# Patient Record
Sex: Male | Born: 1975 | Race: Black or African American | Hispanic: Refuse to answer
Health system: Western US, Academic
[De-identification: ages and names within clinical notes are randomized; demographics above are authoritative.]

## PROBLEM LIST (undated history)

## (undated) DIAGNOSIS — E785 Hyperlipidemia, unspecified: Secondary | ICD-10-CM

## (undated) DIAGNOSIS — E669 Obesity, unspecified: Secondary | ICD-10-CM

## (undated) DIAGNOSIS — M1711 Unilateral primary osteoarthritis, right knee: Secondary | ICD-10-CM

## (undated) DIAGNOSIS — K5792 Diverticulitis of intestine, part unspecified, without perforation or abscess without bleeding: Secondary | ICD-10-CM

## (undated) DIAGNOSIS — Z8669 Personal history of other diseases of the nervous system and sense organs: Secondary | ICD-10-CM

## (undated) DIAGNOSIS — K635 Polyp of colon: Secondary | ICD-10-CM

## (undated) HISTORY — PX: BARIATRIC SURGERY: SHX1103

## (undated) HISTORY — PX: COLONOSCOPY: SHX5228

## (undated) HISTORY — DX: Personal history of other diseases of the nervous system and sense organs: Z86.69

## (undated) HISTORY — DX: Unilateral primary osteoarthritis, right knee: M17.11

## (undated) HISTORY — DX: Hyperlipidemia, unspecified: E78.5

## (undated) HISTORY — DX: Obesity, unspecified: E66.9

## (undated) HISTORY — DX: Diverticulitis of intestine, part unspecified, without perforation or abscess without bleeding: K57.92

## (undated) HISTORY — DX: Polyp of colon: K63.5

---

## 2016-08-31 ENCOUNTER — Ambulatory Visit (INDEPENDENT_AMBULATORY_CARE_PROVIDER_SITE_OTHER): Payer: No Typology Code available for payment source | Admitting: Family Medicine

## 2016-08-31 ENCOUNTER — Encounter (INDEPENDENT_AMBULATORY_CARE_PROVIDER_SITE_OTHER): Payer: Self-pay | Admitting: Family Medicine

## 2016-08-31 ENCOUNTER — Ambulatory Visit: Payer: No Typology Code available for payment source | Attending: Family Medicine

## 2016-08-31 VITALS — BP 108/64 | HR 64 | Ht 69.0 in | Wt >= 6400 oz

## 2016-08-31 DIAGNOSIS — M1711 Unilateral primary osteoarthritis, right knee: Secondary | ICD-10-CM

## 2016-08-31 DIAGNOSIS — B351 Tinea unguium: Secondary | ICD-10-CM | POA: Insufficient documentation

## 2016-08-31 DIAGNOSIS — Z6841 Body Mass Index (BMI) 40.0 and over, adult: Secondary | ICD-10-CM

## 2016-08-31 DIAGNOSIS — G8929 Other chronic pain: Secondary | ICD-10-CM

## 2016-08-31 DIAGNOSIS — M545 Low back pain, unspecified: Secondary | ICD-10-CM

## 2016-08-31 DIAGNOSIS — Z833 Family history of diabetes mellitus: Secondary | ICD-10-CM

## 2016-08-31 DIAGNOSIS — B353 Tinea pedis: Secondary | ICD-10-CM | POA: Insufficient documentation

## 2016-08-31 DIAGNOSIS — R350 Frequency of micturition: Secondary | ICD-10-CM

## 2016-08-31 LAB — URINALYSIS COMPLETE, URN
Bacteria, URN: NONE SEEN
Bilirubin (Qual), URN: NEGATIVE
Epith Cells_Renal/Trans,URN: NEGATIVE /HPF
Epith Cells_Squamous, URN: NEGATIVE /LPF
Glucose Qual, URN: NEGATIVE mg/dL
Ketones, URN: NEGATIVE mg/dL
Leukocyte Esterase, URN: NEGATIVE
Nitrite, URN: NEGATIVE
Occult Blood, URN: NEGATIVE
Protein (Alb Semiquant), URN: NEGATIVE mg/dL
RBC, URN: NEGATIVE /HPF
Specific Gravity, URN: 1.02 g/mL (ref 1.006–1.029)
WBC, URN: NEGATIVE /HPF
pH, URN: 6 (ref 5.0–8.0)

## 2016-08-31 MED ORDER — CLOTRIMAZOLE-BETAMETHASONE 1-0.05 % EX CREA
1.0000 | TOPICAL_CREAM | Freq: Two times a day (BID) | CUTANEOUS | 3 refills | Status: DC
Start: 2016-08-31 — End: 2018-01-19

## 2016-08-31 MED ORDER — TERBINAFINE HCL 250 MG OR TABS
250.0000 mg | ORAL_TABLET | Freq: Every day | ORAL | 2 refills | Status: DC
Start: 2016-08-31 — End: 2017-03-25

## 2016-08-31 NOTE — Progress Notes (Signed)
Loma BostonI, Lauramae Kneisley H. Lucianne Smestad, D.O., interviewed and examined the patient while overseeing the documentation performed by my Medical Scribe. I have reviewed and revised as necessary the scribe's note and agree with the documented findings and plan of care. Please refer to the scribe's note below for further detailed information regarding the patient encounter and exam.  08/31/2016  2:51 PM.

## 2016-08-31 NOTE — Progress Notes (Signed)
DATE: 08/31/2016     ASSESSMENT AND PLAN:  No diagnosis found.    Onychomycosis/fungal infection on the plantar aspect of the feet:  1. Notable physical exam findings in today's examination included: onychomycosis of 5/5 of the right foot. Fungal infection on the plantar aspect of the feet.  2. Provided patient with Rx for Terbinafine HCl 250 mg oral tablets, take 1 tablet Po qD.  3. Provided patient with Rx for Clotrimazole-Betamethasone 1-0.05% External Cream, apply to affected areas of feet.  4. Recommend patient to clean and change socks often, and to wear socks anytime he is putting on new undergarments to avoid spreading the fungal infection to the groin region.    Nocturia:  1. Ordered labs: UA for further evaluation.  2. Follow-up at [facility] for PSA check, and a prostate exam.    Gastric bypass/weight gain/sleep apnea:  1. Possible referral to General Surgeon.  2. Possible referral to [facility].  3. Patient is encouraged to lose weight towards the goal of reaching a healthier BMI, through dietary discretion and exercise. Educated the patient on healthy diet protocol including a high protein and high vegetable diet with complex carbohydrates and low simple sugars. Emphasized healthy eating habits including eating every 3 hours with high protein, high complex carbohydrate, high vegetable, and low simple sugar diet. Provided a guideline for a daily meal plan. Patient is advised to cross-train with walking, biking, elliptical, and swimming.    Health Maintenance/PMHx diverticulitis/PMHx hyperlipidemia:  1. Patient will schedule a complete physical examination.  2. HM will be completed at that time.    All questions answered. Discharge and follow up instructions were discussed with the patient. Pt fully understands and is in agreement with the plan. Prior EMR records reviewed in EPIC as available and clinically relevant.    CHIEF COMPLAINT:   Chief Complaint   Patient presents with   . Establish Care    Establish Care - History of Diverticulits.    . Musculoskeletal Problem     Patient is having multiple areas of discomfort.  Has Mid/Lower Back pain for most of his adult life with increased discomfort.  Worse during winter months.  No real treatment.   Bilateral knee discomfort Right>Left.  Pain mostly on medial aspect of the knee.  No treatment.   He has developed some bruises on his feet and really unsure of cause.     . Urine Problem     Frequent Urination at night time he wants evaluated.     . Toenail     Lichen Plantus-  Family history and wants treated.      HPI:  Lance Lindsey is a 41 year old male who presents with multiple complaints and to establish care.    1. Patient presents with onychomycosis. His son also was Dx with Lichen plantus.    2. Patient additionally presents with nocturia.    3. Patient presents with Hx of diverticulitis.    4. Patient presents with discoloration on feet.    5. Patient presents with multiple areas of pain and discomfort, including mid-low back pain throughout his adult life, and bilateral medial knee pain, right > left.    6. Patient reports that he has been heavy his entire life, and was overweight to obese since he was a child. His wife reports that the patient does have sleep apnea.        PROBLEM LIST:  There is no problem list on file for this patient.  PAST MEDICAL HISTORY:  Past Medical History:   Diagnosis Date   . Lipidemia        SURGICAL HISTORY:  No past surgical history on file.    MEDICATIONS:  No outpatient prescriptions prior to visit.     No facility-administered medications prior to visit.        ALLERGIES:  Review of patient's allergies indicates:  Allergies   Allergen Reactions   . Morphine Nausea/Vomiting       FAMILY HISTORY:  Family History     Problem (# of Occurrences) Relation (Name,Age of Onset)    Arthritis (3) Father, Maternal Grandmother, Maternal Grandfather    Cancer (1) Maternal Grandfather    Diabetes (4) Mother, Father, Maternal  Grandmother, Maternal Grandfather    Heart Disease (2) Mother, Maternal Grandmother    Hypertension (2) Mother, Maternal Grandmother    Kidney Disorder (2) Maternal Grandmother, Maternal Grandfather          SOCIAL HISTORY:  Social History     Social History   . Marital status: N/A     Spouse name: N/A   . Number of children: N/A   . Years of education: N/A     Occupational History   . Not on file.     Social History Main Topics   . Smoking status: Never Smoker   . Smokeless tobacco: Never Used   . Alcohol use No   . Drug use: No   . Sexual activity: Yes     Partners: Female     Other Topics Concern   . Not on file     Social History Narrative   . No narrative on file       REVIEW OF SYSTEMS:  A 14-category review of systems reviewed with the patient, is otherwise unchanged and is noncontributory.     PHYSICAL EXAM:  BP 108/64   Pulse 64   Ht 5\' 9"  (1.753 m)   Wt (!) 415 lb (188.2 kg)   BMI 61.28 kg/m     Physical Examination: Vital signs as noted above.    General Appearance and Mental Status: Well developed, morbidly obese male Oriented x3, behavior is appropriate  EYES: Conjunctiva normal.  HENT: Head is atraumatic, normocephalic. Mucous membranes moist. Posterior oropharynx is without erythema or exudate. Nasal mucosa is normal. Bilateral ear canals and TMs normal.   NECK: Supple, non-tender  RESPIRATORY: Clear to auscultation and percussion without prolonged inspiratory or expiratory phase. No use of accessory muscles of respiration. No end expiratory wheezing.  CARDIOVASCULAR: Regular rate and rhythm with no murmurs, rubs or gallops. No carotid bruits or venous jugular distension.   EXTREMITIES: Moves all extremities well  MUSCULOSKELETAL: normal gait and station  SKIN: Warm, dry, normal skin. Non pallor. No ecchymosis. Negative for rashes, skin lesions, inflammation, induration, petechiae or purpura. Onychomycosis 5/5 toes of the right foot.  NEURO: Alert and oriented to person, place and time. No focal  deficits.     LAB/IMAGING RESULTS:  Labs and imaging were reviewed and interpreted by me, Elon Spanner, DO.     No results found for this or any previous visit.    Portions of this chart were written by Buddy Duty, Medical Scribe with oversight by Elon Spanner, DO on 08/31/2016  2:03 PM

## 2016-08-31 NOTE — Patient Instructions (Addendum)
It was a pleasure to be your provider today. I am very grateful you chose [facility] for your care. [facility] may send you a short survey asking you how we did today and how we can improve as a clinic team. I welcome any suggestions, recommendations and feedback that may help me and my team provide optimal care. If you have additional comments or concerns, please don't hesitate to contact me.     Sincerely,     Elon Spannerhomas H. Jansen, DO  Board Certified Family Practice  [phone]    Please be sure you are signed up for e-care, as this will allow easier access to direct communication with my staff and myself. Thanks again.            AFTER VISIT SUMMARY:    1. If medications were ordered, please pick up your medications from your pharmacy and take as prescribed. If you have additional medication-specific questions, please ask your pharmacist at time of pick-up.    2. Please stop at the front desk before you leave today, to schedule a complete physical examination in the next 4 weeks for FASTED labs (drink as much as you want, but no food for 12 hours prior to the physical exam).    3.    4.

## 2016-09-04 ENCOUNTER — Telehealth (INDEPENDENT_AMBULATORY_CARE_PROVIDER_SITE_OTHER): Payer: Self-pay | Admitting: Family Medicine

## 2016-09-04 NOTE — Telephone Encounter (Signed)
-----   Message from Junious Dresserhomas Howard Jansen, DO sent at 09/01/2016  8:35 AM PDT -----  Call Patient: UA is WNL

## 2016-09-16 ENCOUNTER — Encounter (INDEPENDENT_AMBULATORY_CARE_PROVIDER_SITE_OTHER): Payer: Self-pay | Admitting: Family Medicine

## 2016-09-16 ENCOUNTER — Ambulatory Visit (INDEPENDENT_AMBULATORY_CARE_PROVIDER_SITE_OTHER): Payer: No Typology Code available for payment source | Admitting: Family Medicine

## 2016-09-16 ENCOUNTER — Ambulatory Visit: Payer: No Typology Code available for payment source | Attending: Family Medicine

## 2016-09-16 VITALS — BP 110/75 | HR 81 | Temp 96.5°F | Ht 69.0 in | Wt >= 6400 oz

## 2016-09-16 DIAGNOSIS — Z833 Family history of diabetes mellitus: Secondary | ICD-10-CM | POA: Insufficient documentation

## 2016-09-16 DIAGNOSIS — Z6841 Body Mass Index (BMI) 40.0 and over, adult: Secondary | ICD-10-CM

## 2016-09-16 DIAGNOSIS — N401 Enlarged prostate with lower urinary tract symptoms: Secondary | ICD-10-CM | POA: Insufficient documentation

## 2016-09-16 DIAGNOSIS — R350 Frequency of micturition: Secondary | ICD-10-CM | POA: Insufficient documentation

## 2016-09-16 DIAGNOSIS — Z0000 Encounter for general adult medical examination without abnormal findings: Secondary | ICD-10-CM | POA: Insufficient documentation

## 2016-09-16 DIAGNOSIS — R351 Nocturia: Secondary | ICD-10-CM | POA: Insufficient documentation

## 2016-09-16 DIAGNOSIS — M62838 Other muscle spasm: Secondary | ICD-10-CM | POA: Insufficient documentation

## 2016-09-16 DIAGNOSIS — N400 Enlarged prostate without lower urinary tract symptoms: Secondary | ICD-10-CM | POA: Insufficient documentation

## 2016-09-16 LAB — CBC, DIFF
% Basophils: 0 %
% Eosinophils: 2 %
% Immature Granulocytes: 0 %
% Lymphocytes: 28 %
% Monocytes: 7 %
% Neutrophils: 63 %
% Nucleated RBC: 0 %
Absolute Eosinophil Count: 0.18 10*3/uL (ref 0.00–0.50)
Absolute Lymphocyte Count: 2.6 10*3/uL (ref 1.00–4.80)
Basophils: 0.03 10*3/uL (ref 0.00–0.20)
Hematocrit: 47 % (ref 38–50)
Hemoglobin: 14.6 g/dL (ref 13.0–18.0)
Immature Granulocytes: 0.03 10*3/uL (ref 0.00–0.05)
MCH: 26.4 pg — ABNORMAL LOW (ref 27.3–33.6)
MCHC: 31.2 g/dL — ABNORMAL LOW (ref 32.2–36.5)
MCV: 85 fL (ref 81–98)
Monocytes: 0.68 10*3/uL (ref 0.00–0.80)
Neutrophils: 5.87 10*3/uL (ref 1.80–7.00)
Nucleated RBC: 0 10*3/uL
Platelet Count: 264 10*3/uL (ref 150–400)
RBC: 5.52 10*6/uL (ref 4.40–5.60)
RDW-CV: 15.5 % — ABNORMAL HIGH (ref 11.6–14.4)
WBC: 9.39 10*3/uL (ref 4.3–10.0)

## 2016-09-16 LAB — URINALYSIS COMPLETE, URN
Bilirubin (Qual), URN: NEGATIVE
Epith Cells_Renal/Trans,URN: NEGATIVE /HPF
Epith Cells_Squamous, URN: NEGATIVE /LPF
Glucose Qual, URN: NEGATIVE mg/dL
Ketones, URN: NEGATIVE mg/dL
Leukocyte Esterase, URN: NEGATIVE
Nitrite, URN: NEGATIVE
Occult Blood, URN: NEGATIVE
RBC, URN: NEGATIVE /HPF
Specific Gravity, URN: 1.025 g/mL (ref 1.006–1.029)
WBC, URN: NEGATIVE /HPF
pH, URN: 5.5 (ref 5.0–8.0)

## 2016-09-16 LAB — LIPID PANEL
Cholesterol (LDL): 163 mg/dL — ABNORMAL HIGH (ref <130)
Cholesterol/HDL Ratio: 8.2
HDL Cholesterol: 31 mg/dL — ABNORMAL LOW (ref >39)
Non-HDL Cholesterol: 223 mg/dL — ABNORMAL HIGH (ref 0–159)
Total Cholesterol: 254 mg/dL — ABNORMAL HIGH (ref <200)
Triglyceride: 301 mg/dL — ABNORMAL HIGH (ref <150)

## 2016-09-16 LAB — TESTOSTERONE (ALL AGES): Testosterone: 2 ng/mL (ref 1.8–6.8)

## 2016-09-16 LAB — COMPREHENSIVE METABOLIC PANEL
ALT (GPT): 22 U/L (ref 10–64)
AST (GOT): 22 U/L (ref 9–38)
Albumin: 3.9 g/dL (ref 3.5–5.2)
Alkaline Phosphatase (Total): 90 U/L (ref 36–122)
Anion Gap: 9 (ref 4–12)
Bilirubin (Total): 0.3 mg/dL (ref 0.2–1.3)
Calcium: 9.5 mg/dL (ref 8.9–10.2)
Carbon Dioxide, Total: 27 meq/L (ref 22–32)
Chloride: 100 meq/L (ref 98–108)
Creatinine: 0.99 mg/dL (ref 0.51–1.18)
GFR, Calc, African American: >60 mL/min/{1.73_m2} (ref >59)
GFR, Calc, European American: >60 mL/min/{1.73_m2} (ref >59)
Glucose: 85 mg/dL (ref 62–125)
Potassium: 4.2 meq/L (ref 3.6–5.2)
Protein (Total): 8 g/dL (ref 6.0–8.2)
Sodium: 136 meq/L (ref 135–145)
Urea Nitrogen: 17 mg/dL (ref 8–21)

## 2016-09-16 LAB — PR OCCULT BLOOD, FECES, ONSITE: Occult Blood (1), Stool: NEGATIVE

## 2016-09-16 LAB — THYROID STIMULATING HORMONE: Thyroid Stimulating Hormone: 1.373 u[IU]/mL (ref 0.400–5.000)

## 2016-09-16 LAB — CRP WITH CARDIAC RISK: C_Reactive Protein: 25 mg/L — ABNORMAL HIGH (ref 0.0–10.0)

## 2016-09-16 LAB — PSA, TOTAL & REFLEXIVE FREE: PSA, Diagnostic/Monitoring: 0.18 ng/mL (ref 0.00–4.00)

## 2016-09-16 MED ORDER — CYCLOBENZAPRINE HCL 10 MG OR TABS
10.0000 mg | ORAL_TABLET | Freq: Three times a day (TID) | ORAL | 1 refills | Status: DC | PRN
Start: 2016-09-16 — End: 2019-03-20

## 2016-09-16 NOTE — Progress Notes (Signed)
DATE: 09/16/2016     ASSESSMENT AND PLAN:    ICD-10-CM ICD-9-CM    1. Annual physical exam Z00.00 V70.0 Urinalysis Complete, URN      THYROID STIMULATING HORMONE      TESTOSTERONE (ALL AGES)      OCCULT BLOOD, FECES, ONSITE      OCCULT BLOOD BY IA, STL      LIPID PANEL      HEMOGLOBIN A1C, HPLC      CRP WITH CARDIAC RISK      COMPREHENSIVE METABOLIC PANEL      CBC, DIFF      PSA, TOTAL & REFLEXIVE FREE   2. BMI 60.0-69.9, adult (HCC) Z68.44 V85.44 THYROID STIMULATING HORMONE      LIPID PANEL      HEMOGLOBIN A1C, HPLC   3. Family history of diabetes mellitus in first degree relative Z83.3 V18.0 LIPID PANEL      HEMOGLOBIN A1C, HPLC      COMPREHENSIVE METABOLIC PANEL   4. Benign prostatic hyperplasia with urinary frequency N40.1 600.01 PSA, TOTAL & REFLEXIVE FREE    R35.0 788.41    5. Increased urinary frequency R35.0 788.41 Urinalysis Complete, URN      PSA, TOTAL & REFLEXIVE FREE   6. Nocturia R35.1 788.43 Urinalysis Complete, URN      PSA, TOTAL & REFLEXIVE FREE   7. Routine adult health maintenance Z00.00 V70.0 LIPID PANEL      HEMOGLOBIN A1C, HPLC   8. Other muscle spasm M62.838 728.85 Cyclobenzaprine HCl 10 MG Oral Tab        GERD:  1. Possible EGD referral.    Nocturia/BPH:  1. Notable physical exam findings in today's examination included: prostate exam unable due to body habitus.  2. Ordered labs: PSA for further evaluation.  3. Referral to urology.    Significant foot over-pronation bilaterally:  1. Recommend OTC Dr. Margart Sickles orthotics.  2. If not improved, referral to podiatry for further evaluation.    Low back pain:  1. Patient is provided a Rx for Cyclobenzaprine 10 mg 0.5-1 tab PO BID-TID for muscle spasm at evenings and bedtime.    All question answered. Discharge and follow up instructions were discussed with the patient. Pt fully understands and is in agreement with the plan.     HPI:  The patient additionally presents with intermittent GERD, and emesis. Patient complains of acid reflux.     Patient  additionally has OSA, and uses a sleep apnea machine as per patient's significant other.    Patient presents to re-check nocturia.    Patient presents with Significant foot over-pronation bilaterally.    Patient complains of low back pain.    REVIEW OF SYSTEMS:  A 14-category review of systems reviewed with the patient, is otherwise unchanged and is noncontributory.     PHYSICAL EXAMINATION:   HEENT: Head is atraumatic, normocephalic. TMs are clear. Nasal mucosa is normal. Teeth and gums are normal.   RESPIRATORY:  CTA.  No wheeze, rales, rhonchi.  No use of accessory muscles of respiration.   CARDIOVASCULAR: Regular rate and rhythm with no murmurs, rubs or gallops. No jugular venous distension or carotid bruits.   LYMPH: There is no cervical, axillary, supraclavicular adenopathy.   EXTREMITIES: Full ROM in ankles, knees, hips, wrists and shoulders. Muscle testing is normal.   MUSCULOSKELETAL:   normal gait and station;  upper and lower extremities show no defects in symmetry, crepitance, tenderness,  masses, or effusion normal range of motion without dislocation, subluxation  or laxity; normal muscle tone and strength.  SKIN: Warm, dry, normal color. Negative for rashes, inflammation.  NEURO: Alert and oriented to person, place and time. Distal NVI. Normal sensation and DTR's are equal and symmetrical upper and lower extremities.    -----------------------------------------------------------------------------    DATE: 09/16/2016     ASSESSMENT:    ICD-10-CM ICD-9-CM    1. Annual physical exam Z00.00 V70.0 Urinalysis Complete, URN      THYROID STIMULATING HORMONE      TESTOSTERONE (ALL AGES)      OCCULT BLOOD, FECES, ONSITE      OCCULT BLOOD BY IA, STL      LIPID PANEL      HEMOGLOBIN A1C, HPLC      CRP WITH CARDIAC RISK      COMPREHENSIVE METABOLIC PANEL      CBC, DIFF      PSA, TOTAL & REFLEXIVE FREE   2. BMI 60.0-69.9, adult (HCC) Z68.44 V85.44 THYROID STIMULATING HORMONE      LIPID PANEL      HEMOGLOBIN A1C, HPLC    3. Family history of diabetes mellitus in first degree relative Z83.3 V18.0 LIPID PANEL      HEMOGLOBIN A1C, HPLC      COMPREHENSIVE METABOLIC PANEL   4. Benign prostatic hyperplasia with urinary frequency N40.1 600.01 PSA, TOTAL & REFLEXIVE FREE    R35.0 788.41    5. Increased urinary frequency R35.0 788.41 Urinalysis Complete, URN      PSA, TOTAL & REFLEXIVE FREE   6. Nocturia R35.1 788.43 Urinalysis Complete, URN      PSA, TOTAL & REFLEXIVE FREE   7. Routine adult health maintenance Z00.00 V70.0 LIPID PANEL      HEMOGLOBIN A1C, HPLC   8. Other muscle spasm M62.838 728.85 Cyclobenzaprine HCl 10 MG Oral Tab       PLAN:  Prior EMR records reviewed in EPIC as available and clinically relevant.  Notable physical exam findings:  Morbidly obese, BMI of 60.0 - 69.9 adult.  Very low line palate with redundant tissue.  Mildly limited lumbar flexion, and has to brace his hands on his knees to return to extension, otherwise, Full lumbar and thoracic ROM testing without pain.   Significant foot over-pronation bilaterally  Nevi. ephelides.   Normal male H&P labs ordered.  Continue current medications prescribed.  Medications Ordered this visit:   Patient is provided a Rx for Cyclobenzaprine 10 mg 0.5-1 tab PO BID-TID for muscle spasm at evenings and bedtime.  Regular aerobic exercise benefits discussed as well as healthy eating habits.  Health maintenance reviewed: Lipid screening ordered, Diabetes screening ordered.    All question answered. Discharge and follow up instructions were discussed with the patient. Pt fully understands and is in agreement with the plan.     CHIEF COMPLAINT:   Chief Complaint   Patient presents with   . Physical     Complete Physical & HCC evaluation and treatment.      HPI:  Lance Lindsey is a 41 year old male who presents for a complete physical examination. Fasting.    PROBLEM LIST:  Patient Active Problem List   Diagnosis   . Onychomycosis   . Tinea pedis of both feet   . BMI 60.0-69.9,  adult (HCC)   . Right knee DJD   . Chronic bilateral low back pain without sciatica   . Increased urinary frequency   . Family history of diabetes mellitus in first degree relative   . Annual  physical exam   . Nocturia   . Routine adult health maintenance   . BPH (benign prostatic hyperplasia)   . Other muscle spasm       PAST MEDICAL HISTORY:  Past Medical History:   Diagnosis Date   . Lipidemia        SURGICAL HISTORY:  No past surgical history on file.    MEDICATIONS:  Outpatient Medications Prior to Visit   Medication Sig Dispense Refill   . Clotrimazole-Betamethasone 1-0.05 % External Cream Apply 1 application to affected area on leg(s) 2 times a day. 45 g 3   . Rosuvastatin Calcium ([facility] OR)      . Terbinafine HCl 250 MG Oral Tab Take 1 tablet (250 mg) by mouth daily. 30 tablet 2     No facility-administered medications prior to visit.        ALLERGIES:  Review of patient's allergies indicates:  Allergies   Allergen Reactions   . Morphine Nausea/Vomiting       FAMILY HISTORY:  Family History     Problem (# of Occurrences) Relation (Name,Age of Onset)    Arthritis (3) Father, Maternal Grandmother, Maternal Grandfather    Cancer (1) Maternal Grandfather    Diabetes (4) Mother, Father, Maternal Grandmother, Maternal Grandfather    Heart Disease (2) Mother, Maternal Grandmother    Hypertension (2) Mother, Maternal Grandmother    Kidney Disorder (2) Maternal Grandmother, Maternal Grandfather          SOCIAL HISTORY:  Social History     Social History   . Marital status: N/A     Spouse name: N/A   . Number of children: N/A   . Years of education: N/A     Occupational History   . Not on file.     Social History Main Topics   . Smoking status: Never Smoker   . Smokeless tobacco: Never Used   . Alcohol use No   . Drug use: No   . Sexual activity: Yes     Partners: Female     Other Topics Concern   . Not on file     Social History Narrative    08/31/16: Patient is married, has a son who is 58 years old. Patient works  as an Biomedical scientist and as a DJ. Recently moved here from [place], [place].       REVIEW OF SYSTEMS:  A 14-category review of systems reviewed with the patient, is otherwise unchanged and is noncontributory.     PHYSICAL EXAM:  BP 110/75   Pulse 81   Temp 96.5 F (35.8 C) (Temporal)   Ht 5\' 9"  (1.753 m)   Wt (!) 415 lb (188.2 kg)   SpO2 96%   BMI 61.28 kg/m     Physical Examination: Vital signs as noted above.    General Appearance and Mental Status: Well developed, morbidly obese  male Oriented x3, behavior is appropriate  HEENT: Head is atraumatic, normocephalic. PERRLA and EOMI. B/L ear canals clear. TMs are intact. Nasal mucosa is normal. No septal deviation. Posterior oropharynx normal without erythema or exudate. No pain to palpation over the frontal or maxillary sinuses.  Teeth and gums are normal. Very low line palate with redundant tissue.  NECK: Supple, non-tender to palpation. Normal cervical ROM without pain. No anterior or posterior cervical adenopathy. No supraclavicular adenopathy. No thyromegaly.   RESPIRATORY: Clear to auscultation and percussion without prolonged inspiratory or expiratory phase. No use of accessory muscles of respiration. No end  expiratory wheezing.  CARDIOVASCULAR: Regular rate and rhythm with no murmurs, rubs or gallops. No carotid bruits or jugular venous distension. Distal pulses intact in BUE.  LYMPH: There is no cervical, supraclavicular, axillary, or epitrochlea adenopathy. No palpable cords.  No significant pedal edema.  ABDOMEN: Soft and positive bowel sounds in all four quadrants, non tender, non-tympanic, no rebound or guarding, no hepatosplenomegaly.   GENITOURINARY: No ulcerations or skin lesions. No CVA tenderness. No inguinal hernias bilaterally. Stool guaiac negative using a Seracult Plus guaiac test card during exam. Circumcised, Testes descended x 2, no masses.  prostate exam unable due to body habitus.  EXTREMITIES: Moves all extremities well.  Muscle  testing is normal.  MUSCULOSKELETAL:   normal gait and station;  upper and lower extremities show no defects in symmetry, crepitance, tenderness,  masses, or effusion. Mildly limited lumbar flexion, and has to brace his hands on his knees to return to extension, otherwise, Full lumbar and thoracic ROM testing without pain. No midline or paraspinal tenderness to palpation or percussion. Negative SLR without pain b/l. Full sit-up maneuver. Normal shoulder ROM and shoulder IR at the level of T10. Normal squat, duck walk, and heel and toe weight bearing exams. Significant foot over-pronation bilaterally, No bunions, no bunionettes.  SKIN: Warm, dry, normal color. Non-pallor, No ecchymosis. Negative for rashes, inflammation, induration, petechiae or purpura. No actinic or seborrheic keratosis. Nevi. ephelides. No onychomycosis.   NEURO: Alert and oriented to person, place and time. No focal deficits appreciated.  Normal sensation and DTR's are equal and symmetrical upper and lower extremities. No nystagmus.   PSYCH: Dressed appropriately, good eye contact, calm, normal affect, focused, non-anxious.     LAB/IMAGING RESULTS:  Images are interpreted by me, Elon Spannerhomas H. Jansen, DO.   Labs pending.    Portions of this chart were written by Buddy DutyAdam Coccia, Medical Scribe with oversight by Elon Spannerhomas H. Jansen, DO on 09/16/2016  11:01 AM

## 2016-09-16 NOTE — Progress Notes (Signed)
I, Anyely Cunning H. Calvary Difranco, D.O., interviewed and examined the patient while overseeing the documentation performed by my Medical Scribe. I have reviewed and revised as necessary the scribe's note and agree with the documented findings and plan of care. Please refer to the scribe's note below for further detailed information regarding the patient encounter and exam.  09/16/2016  11:54 AM.

## 2016-09-17 ENCOUNTER — Other Ambulatory Visit (HOSPITAL_BASED_OUTPATIENT_CLINIC_OR_DEPARTMENT_OTHER): Payer: No Typology Code available for payment source

## 2016-09-17 LAB — HEMOGLOBIN A1C, HPLC: Hemoglobin A1C: 5.7 % (ref 4.0–6.0)

## 2016-09-23 ENCOUNTER — Encounter (INDEPENDENT_AMBULATORY_CARE_PROVIDER_SITE_OTHER): Payer: Self-pay | Admitting: Family Medicine

## 2016-09-23 ENCOUNTER — Telehealth (INDEPENDENT_AMBULATORY_CARE_PROVIDER_SITE_OTHER): Payer: Self-pay | Admitting: Family Medicine

## 2016-09-23 DIAGNOSIS — E782 Mixed hyperlipidemia: Secondary | ICD-10-CM

## 2016-09-23 NOTE — Telephone Encounter (Signed)
General Message:    Detailed Message: Patient requested a call back to discuss his recent lab results.  Return Call: Detailed message on voicemail

## 2016-09-24 NOTE — Telephone Encounter (Signed)
Ed sent patient e-care message instructing him to call back to discuss next week when he is back in office as well as Dr Leane Para.

## 2016-09-29 ENCOUNTER — Other Ambulatory Visit (INDEPENDENT_AMBULATORY_CARE_PROVIDER_SITE_OTHER): Payer: Self-pay | Admitting: Family Medicine

## 2016-09-29 ENCOUNTER — Ambulatory Visit: Payer: No Typology Code available for payment source | Attending: Family Medicine

## 2016-09-29 DIAGNOSIS — Z0000 Encounter for general adult medical examination without abnormal findings: Secondary | ICD-10-CM

## 2016-09-29 MED ORDER — ATORVASTATIN CALCIUM 20 MG OR TABS
20.0000 mg | ORAL_TABLET | Freq: Every day | ORAL | 3 refills | Status: DC
Start: 2016-09-29 — End: 2017-11-10

## 2016-09-29 NOTE — Telephone Encounter (Signed)
Called and spoke to patient, discussed all labs in full.  Pended prescription for Dr. Leane Para to look at and approve.

## 2016-09-30 LAB — OCCULT BLOOD BY IA, STL: Occult Bld 1 Result: NEGATIVE

## 2016-10-05 ENCOUNTER — Telehealth (INDEPENDENT_AMBULATORY_CARE_PROVIDER_SITE_OTHER): Payer: Self-pay | Admitting: Family Medicine

## 2016-10-05 NOTE — Telephone Encounter (Signed)
-----   Message from Junious Dresser, DO sent at 09/17/2016  1:55 PM PDT -----  Call patient to review abnormal lab work.High chol(254) with high TRIGs(301), with low HDL(31), therefore high cardiac risk. Plan: ERX Lipitor  1 po q HS # 90 Rx3. Also, start  Fish oil pills 4 grams q day Re-test lipids in 3 months. Try to decrease weight and I would consider Testosterone replacement therapy due to low levels of T. PSA is totally WNL

## 2016-10-12 NOTE — Telephone Encounter (Signed)
Called patient on 09-28-2016

## 2017-01-28 ENCOUNTER — Encounter (INDEPENDENT_AMBULATORY_CARE_PROVIDER_SITE_OTHER): Payer: No Typology Code available for payment source | Admitting: Family Medicine

## 2017-02-11 ENCOUNTER — Ambulatory Visit (INDEPENDENT_AMBULATORY_CARE_PROVIDER_SITE_OTHER): Payer: No Typology Code available for payment source | Admitting: Family Medicine

## 2017-02-11 ENCOUNTER — Ambulatory Visit: Payer: No Typology Code available for payment source | Attending: Family Medicine

## 2017-02-11 ENCOUNTER — Encounter (INDEPENDENT_AMBULATORY_CARE_PROVIDER_SITE_OTHER): Payer: Self-pay | Admitting: Family Medicine

## 2017-02-11 VITALS — BP 123/71 | HR 71 | Ht 69.0 in | Wt >= 6400 oz

## 2017-02-11 DIAGNOSIS — Z833 Family history of diabetes mellitus: Secondary | ICD-10-CM

## 2017-02-11 DIAGNOSIS — Z9229 Personal history of other drug therapy: Secondary | ICD-10-CM | POA: Insufficient documentation

## 2017-02-11 DIAGNOSIS — Z6841 Body Mass Index (BMI) 40.0 and over, adult: Secondary | ICD-10-CM

## 2017-02-11 DIAGNOSIS — B351 Tinea unguium: Secondary | ICD-10-CM

## 2017-02-11 LAB — COMPREHENSIVE METABOLIC PANEL
ALT (GPT): 20 U/L (ref 10–64)
AST (GOT): 23 U/L (ref 9–38)
Albumin: 3.6 g/dL (ref 3.5–5.2)
Alkaline Phosphatase (Total): 81 U/L (ref 36–122)
Anion Gap: 6 (ref 4–12)
Bilirubin (Total): 0.3 mg/dL (ref 0.2–1.3)
Calcium: 9.2 mg/dL (ref 8.9–10.2)
Carbon Dioxide, Total: 28 meq/L (ref 22–32)
Chloride: 103 meq/L (ref 98–108)
Creatinine: 0.93 mg/dL (ref 0.51–1.18)
GFR, Calc, African American: >60 mL/min/{1.73_m2} (ref >59)
GFR, Calc, European American: >60 mL/min/{1.73_m2} (ref >59)
Glucose: 106 mg/dL (ref 62–125)
Potassium: 3.4 meq/L — ABNORMAL LOW (ref 3.6–5.2)
Protein (Total): 7 g/dL (ref 6.0–8.2)
Sodium: 137 meq/L (ref 135–145)
Urea Nitrogen: 13 mg/dL (ref 8–21)

## 2017-02-11 NOTE — Progress Notes (Signed)
REASON FOR VISIT:  1.  Review of medication for nail fungus and concerns about lack of progress.  2.  Referral for consideration of bariatric surgery.      Lance Lindsey is here for his second visit.  The last one was roughly a month ago.  At that time, his examination showed significant and severe onychomycosis of all 10 nails.  They were discolored, dystrophic, thickened and ridged and angulated.  It was a severe case of nail fungus.  He was started on Lamisil 250 mg 1 every day.  He is down to about 2 weeks left out of 12 week supply and he is concerned about lack of progress.  So I will do an exam in a minute and see what we have got.  I did explain to him carefully that the medicine essentially stays in the nail bed causing a suppressive effect on the fungus while healthy new nail grows out.  Eventually all the old fungal infected nail is clipped off and he will have a healthy fresh nail.    Also, patient has a severe overweight concern.  He is 5 feet 9 inches, weighs 415 pounds with a BMI of 61.3.  He and his wife have talked it over, they are very interested in pursuing weight management and possibly a surgical bariatric intervention.    PHYSICAL EXAMINATION:  GENERAL:  He is very pleasant, alert, and oriented, accompanied by his wife, son, and daughter today.  HEENT:  Shows a posterior pharynx with a decreased airway.  No adenopathy.  HEART:  Regular rate and rhythm with a rate of about 70 beats per minute.  No murmurs or gallops noted.  LUNGS:  Clear to auscultation with no CVA tenderness.  As stated, he is significantly overweight with a BMI of 61.3.  SKIN:  Skin evaluation, particularly nails, shows that there is about a quarter inch of healthy nail growth at the base of the great nail and at least one or two others on the right foot, so the nail is actually making good progress with respect to responding to the Lamisil medication.    OVERALL IMPRESSION:  1.  Morbid obesity, BMI of 61.  2.  Family medical  history of diabetes.  3.  Onychomycosis.  4.  Possible side effect to mildly high-risk medication, specifically terbinafine.    PLAN:  1.  Referral to [facility] bariatric surgical department.  2.  Comprehensive metabolic panel to review liver function tests for any effect from the terbinafine.  All questions were answered.  We will see the patient back as needed on a periodic basis.

## 2017-02-11 NOTE — Progress Notes (Signed)
Dictation #1  [id number]  WUX:[id number]

## 2017-02-19 ENCOUNTER — Telehealth (INDEPENDENT_AMBULATORY_CARE_PROVIDER_SITE_OTHER): Payer: Self-pay | Admitting: Family Medicine

## 2017-02-19 NOTE — Telephone Encounter (Signed)
-----   Message from Junious Dresserhomas Howard Jansen, DO sent at 02/12/2017  9:40 AM PDT -----  Call Patient all labs essentially WNL. K+ level is slightly low. Plan: Eat1-2 bannanas q day x 2 weeks to increase K+ level. No LFT abnormality  noted

## 2017-03-22 NOTE — Telephone Encounter (Signed)
Called and left message for patient that per Dr. Leane Para, all labs essentially WNL. K+ level is slightly low. Plan: Eat1-2 bannanas q day x 2 weeks to increase K+ level. No LFT abnormality  noted  If you have any questions or concerns, don't hesitate to call or e-mail me.

## 2017-03-24 ENCOUNTER — Encounter (HOSPITAL_BASED_OUTPATIENT_CLINIC_OR_DEPARTMENT_OTHER): Payer: Self-pay

## 2017-03-24 NOTE — Progress Notes (Signed)
BARIATRIC CLINIC OUTPATIENT CONSULTATION  03/25/2017     CHIEF COMPLAINT: "I wanna get the Roux-en-Y surgery"    HISTORY OF PRESENT ILLNESS:  Lance Lindsey is a 41 year old male referred by another physician: Dr. Leane Para for evaluation morbid obesity and for consideration for a surgical weight loss procedure. The patient has a Ht  (1.753 m) and Wt 428 lb 8 oz (194.366 kg) resulting in a  Body mass index is 63.28 kg/m. His highest adult weight was 448 pounds and this was 2-3 years ago. The patient has attended one of our informational seminars where information on various weight loss procedures and obesity were discussed. The patient says that he has undergone evaluation for bariatric surgery 4 times in the past at different location in the [place]. At least once he says he got to the "pre op" phase but has issues with changes in insurance coverage, and another time the hospital lost accreditation. He is interested in a laparoscopic Roux-en-Y gastric bypass. He feels from his prior experience that he has the most knowledge and information, and seems very effective. The patient has tried multiple methods of non-surgical weight loss in the past, including changing his eating habits (cutting carbs, reading nutritional labels to watch calories, using MyFitnessPal app on his phone) and exercise. He has undergone MSWL multiple times.  He has been able to lose up to 48 lbs with these attempts.  The patients exercise routine consists of treadmill 3 times per week for an hour.  The patient's dietary habits are significant for eating two meals per day. He eats a mid-morning meal consisting of two hot pockets. For dinner he usually has something his wife cooks. She tries to cook meat, protein, with smaller starch or pasta. He drinks a lot of water, propel, no sugar gatorade,  occasionally drinks a sugary beverage. He occasionally snacks on things like chicken nuggets several times per month. He does not eat late at night.   The patient feels that the major cause for weight gain is inconsistency with exercise.    The patient suffers from the following comorbid conditions associated with morbid obesity.  These include hyperlipidemia, obstructive sleep apnea, osteoarthritis and GERD. He also has some difficulty with chafing and open wounds on his abdominal skin folds.     The patient also reports:  no dysphagia   yes evidence of regurgitation  Yes  GERD, not on PPI    He indicates that he has undergone EGD and colonoscopy several years ago. They do not recall any specific abnormalities with these studies.     Bowel function daily regular bowel movements. He does have a history of diverticulitis that is aggravated by seeds, corn, peanuts.    The patient doesn't confirm a history of gallbladder disease.      Outside records were reviewed.  Relevant workup to date is summarized below:  Labs: Date: 02/11/17, CMP normal with the exception of potassium (K=3.4).   09/16/16: Elevated total cholesterol (254), LDL (163), and triglycerides (301). HbA1C 5.7.         PAST MEDICAL HISTORY:   Past Medical History:   Diagnosis Date    Colon polyps     Diverticulitis     History of sleep apnea     Lipidemia     Obesity     Right knee DJD       Patient Active Problem List   Diagnosis    Onychomycosis    Tinea pedis of both  feet    BMI 60.0-69.9, adult    Right knee DJD    Chronic bilateral low back pain without sciatica    Increased urinary frequency    Family history of diabetes mellitus in first degree relative    Annual physical exam    Nocturia    Routine adult health maintenance    BPH (benign prostatic hyperplasia)    Other muscle spasm    History of high risk medication treatment         PAST SURGICAL HISTORY:   Past Surgical History:   Procedure Laterality Date    COLONOSCOPY          FAMILY AND SOCIAL HISTORY:   family history includes Arthritis in his father, maternal grandfather, and maternal grandmother; Cancer in his maternal  grandfather; Diabetes in his father, maternal grandfather, maternal grandmother, and mother; Heart Disease in his father, maternal grandmother, and mother; Hypertension in his father, maternal grandmother, and mother; Kidney Disorder in his maternal grandfather and maternal grandmother; Obesity in his mother.   Social History   Substance Use Topics    Smoking status: Never Smoker    Smokeless tobacco: Never Used    Alcohol use No        Lives in [place] with wife and 3 kids. Recently moved to the area because he wife got a job and at [employer]. The patient is an Pharmacist, hospital and invests in homes. No alcohol, tobacco, or drugs.      MEDICATIONS:   Current Outpatient Prescriptions   Medication Sig Dispense Refill    Atorvastatin Calcium 20 MG Oral Tab Take 1 tablet (20 mg) by mouth daily. 90 tablet 3    Clotrimazole-Betamethasone 1-0.05 % External Cream Apply 1 application to affected area on leg(s) 2 times a day. 45 g 3    Cyclobenzaprine HCl 10 MG Oral Tab Take 1 tablet (10 mg) by mouth 3 times a day as needed for muscle spasms. 24 tablet 1    Rosuvastatin Calcium ([facility] OR)       Terbinafine HCl 250 MG Oral Tab Take 1 tablet (250 mg) by mouth daily. (Patient not taking: Reported on 03/25/2017) 30 tablet 2     No current facility-administered medications for this visit.          ALLERGIES:   Review of patient's allergies indicates:  Allergies   Allergen Reactions    Morphine Nausea/Vomiting    Fish Allergy Nausea/Vomiting        REVIEW OF SYSTEMS:   A complete ROS of completed. In addition to the findings listed in the HPI, the patient reports fatigue/trouble sleeping, history of carpal tunnel syndrome, blood in stool, joint/back pain, stiffness/arthritis, allergies. All other systems are negative.      SLEEP APNEA SCREENING: Previous diagnosis of OSA, uses CPAP every night.      PHYSICAL EXAM:  BP 122/78    Pulse 76    Temp 97.8 F (36.6 C) (Temporal)    Ht 5\' 9"  (1.753 m)    Wt (!) 428 lb 8 oz (194.4 kg)     SpO2 98%    BMI 63.28 kg/m    Physical Exam   Constitutional: He is oriented to person, place, and time.   Well developed obese male in no apparent distress.   HENT:   Head: Normocephalic and atraumatic.   Cardiovascular: Normal rate, regular rhythm and normal heart sounds.    Pulmonary/Chest: Effort normal and breath sounds normal. No respiratory distress.   Abdominal:  Soft. Bowel sounds are normal. He exhibits no distension. There is no tenderness. There is no rebound.   No surgical scars appreciated.   Neurological: He is alert and oriented to person, place, and time. GCS score is 15.   Skin: Skin is warm and dry.   Psychiatric: Mood, memory, affect and judgment normal.         ASSESSMENT:  Lance Lindsey is a 41 year old male who is morbidly obese with a Body mass index is 63.28 kg/m.  He has tried multiple methods of weight loss in the past and has not had success.  He has attended one of our informational seminars and came today interested in laparoscopic Roux-en-Y gastric bypass. The patient is appropriate for evaluation for a possible weight loss operation.  We will enroll the patient and pursue a multidisciplinary workup as outlined below.      1.  We discussed and went over the risks, benefits, expected outcomes, weight loss, and lifestyle changes associated with these operations. The risks discussed include but are not limited to bleeding, bruising, pain, infection, scarring, anastomotic leak, bleeding, stricture, MI, PE, stroke, infection, malnutrition, increased incidence of alcohol addiction and dumping syndrome.  Other complications can include bowel obstruction, chronic nausea and the need for prolonged nutritional supplementation.  The patient understands that these risks and complications can lead to readmission, reoperation, and even death.  The patient understood our discussion today and wishes to proceed.  We answered all questions in detail and to the patient's satisfaction.  The  patient was accompanied by spouse today at the time of our consultation.     2. This workup will consist of the following: screening lab work, social work consult, UGI, EGD, pH, manometry and nutrition consult.      3. Once this workup is completed, the patient will return to our clinic for re-assessment by one of our providers.  At that time, based upon the results of this workup, we will make a decision on whether or not to pursue surgery.      The patient thinks that changing his snacks would probably be one of the best things he could do. He would try replacing some of these snacks with a protein bar or a shake. As a family they have "slacked off" on going to the gym. He thinks he can go 4-5 times per week.       Instructed the patient to also confirm criteria by insurance and details of the 6 months MSWL.        Must start by using smaller plates, cups, bowls and utensils to decrease portion size.  Chewing food and eating slower.   Food and drink choice changes include make changes to snacking habits including replacing current snacks with protein shakes and bars.    Activity goal to start include increasing exercise to 5 times per week for at least 30 minutes, but ultimately aerobic exercise of a minimum of 150 minutes a week will promote best setting for weight loss.      Please make diet changes and exercise to lose as much as weight as possible prior to surgery, at least 28 pounds for a goal weight of 400 pounds.      I saw and evaluated the patient and agree with Dr. Carylon Perches' note.

## 2017-03-25 ENCOUNTER — Encounter (HOSPITAL_BASED_OUTPATIENT_CLINIC_OR_DEPARTMENT_OTHER): Payer: Self-pay | Admitting: Surgery

## 2017-03-25 ENCOUNTER — Ambulatory Visit: Payer: No Typology Code available for payment source | Attending: Surgery | Admitting: Surgery

## 2017-03-25 VITALS — BP 122/78 | HR 76 | Temp 97.8°F | Ht 69.0 in | Wt >= 6400 oz

## 2017-03-25 DIAGNOSIS — E785 Hyperlipidemia, unspecified: Secondary | ICD-10-CM | POA: Insufficient documentation

## 2017-03-25 DIAGNOSIS — R1013 Epigastric pain: Secondary | ICD-10-CM | POA: Insufficient documentation

## 2017-03-25 DIAGNOSIS — Z6841 Body Mass Index (BMI) 40.0 and over, adult: Secondary | ICD-10-CM | POA: Insufficient documentation

## 2017-03-25 NOTE — Progress Notes (Signed)
IMPRESSION and PLAN:      Lance Lindsey is interested in bariatric surgery, specifically laparoscopic Roux-en-Y gastric bypass. His is currently with a Body mass index is 63.28 kg/m. and Wt 428 lb 8 oz (194.366 kg).    Risk factor reduction includes the chance of reducing or eliminating the comorbidities that are driven by his obesity: Hypertension, OSA, OA and GERD.    Hyperlipidemia: currently controlled with statin and obesity only worsens this disease.   OSA: on cpap    I emphasized and counseled on the following:   Anatomies of the bariatric surgery options. We also went over benefits and potential risks such as but not limited to possibility of bleeding, infections, further interventions, bowel leak, clotting, injury to adjacent structures, organ failure, stroke, heart events and death. In addition other complications in the lifetime can include food intolerances, pain, nausea, marginal ulceration, obstructions, strictures hernias, internal hernias, nutritional deficiencies, addiction conversion, severe reflux and weight regain.    Importance of compliance with chosen treatment options and reiterating the patient contract. Patient must abide by that contract for compliance.  No nicotine use, smoking or NSAID use for lifetime after bariatric surgery.   Long term vitamin supplementation and adherence to [facility] Weight Management Team's recommendations.   Patient and family education as to what to expect in enrollment and need to demonstrate changes.   Screening labs.   CPAP compliance letter.   Nutrition consult.   Social work consult.   EGD for dyspepsia and evaluation for H. Pylori infection (a known risk factor for ulceration).   Of note, I discussed that his anatomy and BMI may be more risk with a roux en y gastric bypass and encourage to consider if sleeve was an option. To help understand risks of reflux and potential dysphagia, I recommend an UGI, pH and manometry studies, but is not a requirement to  move toward test review.   Enrollment weight is 428 lbs. Goal weight is 400 lbs (with 28 lbs).   6 months MSWL (beginning, middle end appointments).

## 2017-03-25 NOTE — Patient Instructions (Addendum)
You will be enrolled in the [facility] bariatrics program today.  A multidisciplinary workup will be scheduled for you as outlined on your after visit summary.  You will be contacted to schedule your testing and for follow up.    Increase aerobic exercise to at least 150 minutes per week.  Keep a log of all exercise to bring with you to all visits.   DO NOT GAIN WEIGHT. This will delay your surgery.    Insurance coverage and associated cost to you are up to your insurance. Please clarify with them. We cannot make assurances to you about this.  Check with them how many DIETICIAN visits are permitted/covered in a year.    See Checklist in manual. These are REQUIRED to be done before you can have surgery.    If you have any problems completing your checklist please do not hesitate to call    After surgery, attendance of appointments and nutrition education is expected.    Please make diet changes and exercise to lose as much as weight as possible prior to surgery, at least 28 pounds.    We discussed the expectations in enrollment in the [facility] Bariatric Surgery Program and need to demonstrate changes.     Nutrition consult   Social work consult   Screening labs   EGD for dyspepsia   PH and manometry study to evaluate esophageal function   Enrollment weight is 428 lbs.  Goal weight is 400 lbs   6 months MSWL (with beginning, middle, and end)   CPAP compliance     Must start by using smaller plates, cups, bowls and utensils to decrease portion size.  Chewing food and eating slower.    Food and drink choice changes include Make changes to snacking habits including replacing current snacks with protein shakes and bars.    Activity goal to start include increasing exercise to 5 times per week for at least 30 minutes, but ultimately aerobic exercise of a minimum of 150 minutes a week will promote best setting for weight loss.

## 2017-03-26 ENCOUNTER — Other Ambulatory Visit (HOSPITAL_BASED_OUTPATIENT_CLINIC_OR_DEPARTMENT_OTHER): Payer: Self-pay | Admitting: Surgery

## 2017-03-26 DIAGNOSIS — R1013 Epigastric pain: Secondary | ICD-10-CM

## 2017-04-14 ENCOUNTER — Ambulatory Visit: Payer: No Typology Code available for payment source | Attending: Surgery | Admitting: Clinical

## 2017-04-14 ENCOUNTER — Ambulatory Visit (HOSPITAL_BASED_OUTPATIENT_CLINIC_OR_DEPARTMENT_OTHER): Payer: No Typology Code available for payment source | Admitting: Registered"

## 2017-04-14 VITALS — Wt >= 6400 oz

## 2017-04-14 DIAGNOSIS — E785 Hyperlipidemia, unspecified: Secondary | ICD-10-CM

## 2017-04-14 DIAGNOSIS — Z6841 Body Mass Index (BMI) 40.0 and over, adult: Secondary | ICD-10-CM

## 2017-04-14 DIAGNOSIS — Z833 Family history of diabetes mellitus: Secondary | ICD-10-CM

## 2017-04-14 DIAGNOSIS — Z713 Dietary counseling and surveillance: Secondary | ICD-10-CM | POA: Insufficient documentation

## 2017-04-14 DIAGNOSIS — F4329 Adjustment disorder with other symptoms: Secondary | ICD-10-CM | POA: Insufficient documentation

## 2017-04-14 NOTE — Progress Notes (Signed)
Cleared nutrition for surgery:  No.  Not yet    Deferral item:  He is to make an appointment with one of our dietitians sometime on or after his 05/12/2017 portions class.  He will keep food journals until that time and provide evidence of making progress toward eating more frequently in the daytime -- aiming for a goal of eating roughly every 3-4 hours.  He does not have to be "perfect" in eating every 3-4 hours to clear nutrition for surgery; rather, he needs to demonstrate making progress toward this goal.           [facility]  Initial Nutrition Evaluation for Bariatric Surgery    Referred by: Dr.Chen-Meekin  Time spent with patient: 60 minutes  Medical Diagnosis: BMI 60+  Past medical history significant for:   Active Ambulatory Problems     Diagnosis Date Noted    Onychomycosis 08/31/2016    Tinea pedis of both feet 08/31/2016    BMI 60.0-69.9, adult ([place]) 08/31/2016    Right knee DJD 08/31/2016    Chronic bilateral low back pain without sciatica 08/31/2016    Increased urinary frequency 08/31/2016    Family history of diabetes mellitus in first degree relative 08/31/2016    Annual physical exam 09/16/2016    Nocturia 09/16/2016    Routine adult health maintenance 09/16/2016    BPH (benign prostatic hyperplasia) 09/16/2016    Other muscle spasm 09/16/2016    History of high risk medication treatment 02/11/2017    Hyperlipidaemia 03/25/2017    Dyspepsia 03/25/2017     Resolved Ambulatory Problems     Diagnosis Date Noted    No Resolved Ambulatory Problems     Past Medical History:   Diagnosis Date    Colon polyps     Diverticulitis     History of sleep apnea     Lipidemia     Obesity     Right knee DJD      Weight today: 429.9 lb  BMI: Estimated body mass index is 63.28 kg/m as calculated from the following:    Height as of 03/25/17: 5' 9"  (1.753 m).    Weight as of 03/25/17: 428 lb 8 oz (194.4 kg).  Initial weight:  428.5 lb Date: 03/25/17  Estimated protein needs:  75-110 g (1-1.5 g/kg IBW)  Labs : A1c 5.7 (09/16/16 -- Medical staff can determine whether or not this is recent enough), Albumin 3.6, 02/11/17  Goal Weight: 400# per Dr. Joseph Art note     Patient Interview  Motivation for weight loss surgery: wants to improve quality of life, wants to enjoy his children   Previous weight loss attempts: (pasted from Dr. Lianne Moris intake note and reviewed/confirmed with patient) -- cutting carbs, reading nutritional labels to watch calories, using MyFitnessPal app on his phone) and exercise.He has undergone MSWL multiple times.  He has been able to lose up to 48 lbs with these attempts.   States highest weight was 448 lbs at highest weight.      Patient desiring RYGB to improve health and mobility  Patient has not been meeting with a local dietitian for diet education. Will meet with our clinic to do that.  (As part of prior bariatric programs met with dietitian(s).  Pt is able to identify the following concepts about nutrition/post-surgical nutrition: knows he needs adequate protein intake, lifelong vitamin supplementation, first week or so on liquids, graduate to soft foods, advance further after that    Currently eating habits/routine:  Wake-up: 5-6 AM  B/L: 10AM-12 PM -- chicken nuggets  Sn:  No   D:  Varies -- yesterday veggie yakisoba with shrimp; eats typically between 5-7 PM; usually wife cooks dinner  Sn:  Not typically   Common beverages: water; no coffee; occasionally drinks low sugar Gatorade -- one-2 small single serve containers every 2-3 days      Food allergies: fish (not shellfish)  Food Intolerances/Avoidances: states no  Living Situation/Grocery shopping/Cooking: lives with wife and 3 kids ages 46, 58, and 28; wife does the grocery shopping and cooking  Alcohol: none Last time had a drink: can't even remember -- maybe 2 years ago on a cruise  Smoking: no    Hx of eating disorder/significant emotional/binge eating: does not feel he's had this problem  Sleep:  typically gets 6 hours of sleep, he reports; does endorse sleep apnea diagnosis, uses CPAP; does report feeling rested for the most part upon waking; has plans to change schedule to go to bed earlier   Vitamins/minerals: not currently    Digestion/dentition: reports having diverticulitis -- can't eat corn, things with seeds, peanuts, says he was given dietary restrictions in 2012 (had a flare and was given dietary restrictions at that time)   Blood Sugar: doesn't check; no noted hx of diabetes  Exercise: lots of stairs in house -- going up and down at home   Occupation: not currently working outside the home      Calorie goal for weight loss:  1735 is a 500 kcal deficit using [place] with a 1.1 activity factor.  OK to go down to as low as 1500 for a slightly greater deficit, though if exercise increases he should stick with 1735.      At 1735 calories:    Carbs (g) Protein (g) Fats (g)   45% 20% 35%   195 87 67            Not currently tracking using MyFitnessPal, but willing to start    Assessment & Education  Nutrition diagnosis: Obesity r/t physical inactivity and past excess energy intake as evidenced by BMI>30, and patient report of sedentary activities and diet recall.      Pattern of long periods without eating, inadequate fiber/fruit/vegetable intake.      Uncertain if current diabetes control/management is acceptable as indicated by A1c; A1C 5.7 09/11/16 -- medical team can assess whether this is recent enough     He was given the following instructions in addition to standard patient instructions (see wrap up/patient instructions):    Aim to eat every 3-4 hours.  Make an appointment with a dietitian around the time you come back for your portions/tracking class and show her your food journals to demonstrate progress toward eating every 3-4 hours.   After that visit is fine -- just make sure the time gap is at least 4 weeks from now.       Add in higher fiber carbohydrate sources -- 195 grams of  carbohydrate per day is just fine.  Whole grains -- for example whole grain breads, brown rice, quinoa/millet/oatmeal, whole grain noodles -- vegetables, fruits.      Track using MyFitnessPal    Reviewed Life-long Lifestyle and Diet Changes:  Requirement for life-long change in eating habits to begin now  Life-long intake of specific vitamins and minerals  Risk of Dumping Syndrome  Importance of exercise beyond daily ADLs    No interpretive services needed. Education was provided taking into consideration  the patient's learning abilities, readiness to learn and cultural beliefs. The patient was able to verbalize understanding of material reviewed below and they are  ready to start changing habits. Pt attended with spouse today.    Medical Nutrition Therapy Goals: Long term weight management and improved health  Patient Goals:   See "Patient Instructions" for full nutrition education details

## 2017-04-14 NOTE — Progress Notes (Signed)
__X_NO  ___YES  The patient demonstrates willingness to comply with the preoperative and postoperative treatment plans. The patient has been cleared psychosocially to proceed to bariatric surgery as of ____  .      Lance Lindsey  41 year old  [birth year]  Surgery: RYGB  Referral Source: Rhett Bannister, MD  [address]  [place] [id number]  [place], [place] [address]  Weight/BMI: 429.9 lb/63.49    Duration: 60 minutes    Present at evaluation: Patient and wife, Lance Lindsey.     Social Supports & Post-Operative Care Plan  Follow-Up Appointment Plan: The patient will drive himself to his appointments. If he is unable to do so his wife will drive him.     Social Support System: Lance Lindsey lives in [place], [place] with his wife and three children ages 41, 87 and 1. He moved to the area from [place] because his wife got a job at [employer] [place]. The patient reports that his children are doing well. 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 The patient reports that he has been overweight since childhood. He believes that his weight gain is related to poor food choices and lack of exercise. Lance Lindsey denies frequent snacking or emotional eating. He states that he often forgets to eat during the day. He has not yet made any changes in his habits since his intake visit on 03/25/17. The patient reports that his wife is very supportive of his desire to have surgery and to lose weight.     Post-Operative Care Plan: The patient's wife, Lance Lindsey ([phone]), will transport him to and from surgery and will be his post-operative caregiver.     Have an adequate post-operative care plan? NO    Income Source: Wife's employment.   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   Medical Insurance:  Payor/Plan Subscriber Name Rel Member # Group #   [employer]* Lance Lindsey,Lance Lindsey SPO  [id number]      [address]         Patient's primary surgery goals: "To live  a long and healthy life." Lance Lindsey is concerned that he will develop medical problems related to obesity that will shorten his life span. He would also like to be more mobile.     Does the patient:   Know about bariatric surgery support groups? YES  Has the patient been encouraged to attend? YES  Take current medication/therapies as prescribed? YES  Know they may need to purchase nutritional supplements out of pocket? YES    Mental Health    Presentation:    Appearance: Neatly groomed and dressed.   Behavior/activity: No sign of psychomotor agitation or retardation.   Speech: Normal rate, prosody and volume.   Affect: Appropriate to content.   Mood: Euthymic.   Thought form: Linear, goal directed and coherent.   Thought content: No sign of thought disorder or hallucination.   Orientation: Fully oriented.  Attention/concentration: No indication of distractibility.   Insight and judgement: Good.     Mental Health Diagnosis:Lance Lindsey denies any current or historical mental health diagnoses. He denies having had problems with depression, anxiety or other problems.     Psychotropic Medications: None.     Prescribing Provider: N/A    Mental health treatment: None    Length of psychiatric stability: Ongoing    Suicidal history:Denied    Abuse/Mistreatment: Denied    GAD-7  Over the last two  weeks, how often have you been bothered by the following problems?  Feeling nervous, anxious, or on edge: Not at all  Not being able to stop or control worrying: Not at all  Worrying too much about different things: Not at all  Trouble relaxing: Not at all  Being so restless that it is hard to sit still: Not at all  Becoming easliy annoyed or irritable: Not at all  Feeling afraid, as if something awful might happen: Not at all  Total Score  Total: 0  If you checked any problems, how difficult have they made it for you to do your work, take care of things at home, or get along with other people?: Not difficult at all    PHQ-9  Over the  last 2 weeks how often have you been bothered by any of the following problems?  1. Little interest or pleasure in doing things: Not at all  2. Feeling down, depressed or hopeless: Not at all  3. Trouble falling or staying asleep, or sleeping too much: Several days  4. Feeling tired or having little energy : Several days  5. Poor appetite or overeating: Not at all  6. Feeling bad about yourself - or that you are a failure or have let yourself or your family down: Not at all  7. Trouble concentrating on things, such as reading the newspaper or watching television: Not at all  8. Moving or speaking much more slowly than usual.  Or the opposite - fidgety or restless: Not at all  9. Thoughts that you would be better off dead or of hurting yourself in some way: Not at all    Total  PHQ9 Total Score: 2    10. If you checked off any problems, how difficult have these problems made it for you to do your work, take care of things at home, or get along with other people?: Not difficult at all    CAGE Screening  Cage Questionnaire  Have you ever felt you should cut down on your drinking?: No  Have people annoyed you by criticizing your drinking?: No  Have you felt bad or guilty about your drinking?: No    EE-I (emotional eating index, out of 40) score: 2-No or little emotional eating. Patient denies any problems with emotional eating. Denies eating when upset, sad, angry or bored.     Does the patient report symptoms of current or historical eating disorder? NO    QEWP-R: Not completed. Verbal questions do not indicate a problem with BED.     Coping Skills: Rides his motorcycle. Loves to relax by listening to music, patient says "it's a passion."    Chemical Dependency  Tobacco:None    Alcohol: None    Illicit Drugs: None    Treatment: None      Recent behavior changes: None    Weight loss in last 3 months: Gain of 1 lb. Mr. Lance Lindsey states that he has not implemented any changes in his dietary or activity habits. The patient was  asked to lose 28 lbs by Dr. Imogene Burnhen. His goal weight is 400 lbs.     Current physical activity: Walking. The patient states that he will begin to work out at gym this week and is planning on beginning to track his food and reduce caloric intake as well. "I won't have trouble doing it."    The patient was reminded that they are to track their exercise and bring their log to their physical  therapy appointment for review.   Summary by Assessment Area  Patient cleared for:   Post-operative care plan YES  Medical adherence YES  Mental health YES  Chemical dependency (includes smoking) YES  Surgery preparation NO  Eating behaviors YES     Summary: Lance Lindsey is a pleasant patient who was referred to social work as part of a multidisciplinary assessment for bariatric surgery. He has been through the work-up for bariatric surgery 4 times and insurance problems have resulted in him never getting surgery. It is concerning that he has made no changes in the nearly three weeks since he was enrolled in this program. The patient appears to have a good understanding of the changes he needs to make both pre and post-surgery. It remains to be seen if he can make the changes necessary to prepare for surgery and to lose the weight required by his surgeons.   The patient's wife is very supportive and he has the financial resources available to eat healthy food and enroll in a gym.     Recommended plan     From a social work perspective the following issues will need attention prior to proceeding to surgery:    1. The patient will attend at least one in-person support group and will explore support and information web sites for bariatric surgery patients.    2. The patient will begin to exercise 150 minutes per week.    The patient was also asked to focus on eating 3 meals and two snacks each day and tracking food and exercise.     When the above issues have been successfully addressed the patient may be a reasonable candidate for  surgery.  At that time they may be forwarded to test review.   The patient was given resource information including how to contact social work with questions. Local support group information given. The patient understands and is in agreement with this plan as outlined above. Social work is available to assist as needed.        Venetia Maxon, MSW

## 2017-04-14 NOTE — Patient Instructions (Signed)
Patient Goals:     Aim to eat every 3-4 hours.  Make an appointment with a dietitian around the time you come back for your portions/tracking class and show her your food journals to demonstrate progress toward eating every 3-4 hours.   After that visit is fine -- just make sure the time gap is at least 4 weeks from now.       Add in higher fiber carbohydrate sources -- 195 grams of carbohydrate per day is just fine.  Whole grains -- for example whole grain breads, brown rice, quinoa/millet/oatmeal, whole grain noodles -- vegetables, fruits.      Track using MyFitnessPal    BEHAVIOR MODIFICATION  Connect with a support group and review bariatric information online to improve knowledge and understanding of bariatric diet and eating habits for success    Consider meeting with a therapist to improve relationship with food to achieve and maintain a healthy weight lifelong    Journal or keep a diary if notice eating after stress, boredom, anger, anxiety, grazing    Get family on the same healthy eating track    Avoid mindless eating or Grazing out of the bag or box    Always portion food items into small dishes so they look full    Avoid using food as a reward    Take the time to listen to your body and not eat to point of feeling full or stuffed    Consistency is Key to Successful weight loss and maintenance    Be MINDFUL of Calories from ALL sources including dips, dressings, sauces, butter, mayo, ketchup, sugar, latte's, creamers, etc    Remember this is not dieting but learning to eat in a healthy manner      SUCCESSFUL STRATEGIES FOR EATING  Prepare smaller meals, portion out food into small containers right after cooking    Eat 3 meals and 1-2 small snacks   - Eat protein meal or snack every 3-4 hours    Get started with being satisfied by smaller portions by choosing a smaller plate or salad plate:   www.https://www.bernard.org/   - Use an 8 inch plate for meals (salad plate for most dish sets)   - 1/4 plate is a lean  protein   - 1/2 plate is a non-starchy vegetable   - 1/4 plate is a fruit or additional serving of non-starchy vegetable or       low-fat whole grain product    Chew food well  - Try to chew each bite 20 times or until it feels like applesauce  - Try to eat with your non-dominant hand  - Put your fork or spoon down between each bite of food  - Try to eat 1 bite of food per minute  - Cut food into small pieces  - Each meal should take between 20-30 minutes    Get comfortable leaving 1-2 bites of food on the plate at every meal    Avoid second servings    Attend the "Portions Class"   - Available on either the first or third Wednesday of the month from 8:30-9:30 am    CALORIE, PROTEIN, FAT, CARB GOALS:  Start measuring, weighing, and tracking calories to help with reaching pre-surgery weight goal and understanding where calories come from     - Use Baritastic, Myfitnesspal, or other calorie tracking app on your phone or computer to track calories with a    - Goal of 1500-1735 calories daily   -  Goal of 87 g Protein daily (25%)   - Goal of 67 g Total Fat daily (35%)   - Goal of 195 g Total Carbohydrates daily (40%)     - Review StreamingFood.com.cybaritastic.com for how to use videos under the "App Tutorials" tab     - Start tracking every single item you eat or drink for 7 days, include weekends   - If you are eating about 2500 calories a day, the following week you      decrease to 2400 calories a day   - Each week you decrease the daily calories by 100 until you reach your goal   - Measure all food, dips, sauces, liquids, sugar, etc   - Be sure to choose the correct portion size from the selections   - Don't eat the calories that you have burned during exercise   - The goal is to continue to lose weight while meeting nutrition needs       HEALTHY FOODS:  Protein every meal and snack   - Not breaded   - Not fried   - Remove skins and excess fat   - Chicken, [place], Pork, Beef   - Eggs   - Legumes, lentils, quinoa    Choose more colorful  lower carb vegetables as healthy fillers such as:   - Broccoli, cauliflower, zucchini, asparagus, spinach, greens, green beans, mushrooms, radishes, cucumber, bell peppers   - Limit butter, dips, dressings, hummus, oils, or fats for dipping or taste   - Avoid battered, breaded or fried vegetables   - Enjoy but Limit the starchy vegetables like corn, potato, yams, sweet potato, winter squash    Choose small portions of colorful fruits in place of breads, snacks, sweets, etc.    - BUT limit or measure the portions as all fruits are a source of carbohydrates    - 12 grapes, small apple, 1/2 cup blueberries, 2 kiwi, 1 cup fresh strawberries   - Limit peanut butter, dips, caramel, etc    Enjoy Whole Grains in small portions   - Whole grain breads, wild rice, brown rice, plain oatmeal    Choose LESS than 1 small serving of 1 sugar sweetened food a week including:   - Beverages, desserts or baked goods, donuts, candies or chocolates, ice cream    Eat healthy fats in very small amounts   - Avocado, olives, olive oil, coconut oil, nuts, nut butters, seeds, small amounts of cheese              - Avoid battered, fried foods, high fat cream sauces or cheese sauces    HYDRATION  Avoid liquids during meals   - Avoid putting the glass or cup on the table at meal times    Start practicing sipping at least 64 oz of hydrating liquids daily   - Hydrating liquids are those that don't contain any caffeine or protein   - They shouldn't have any calories or be carbonated    Be sure to consider and decrease the size of the "Drinkable Calories" when tracking intake   - Decrease the size and/or type of your latte or coffee or iced coffee   - Alcohol, juice, milk, soda, energy drinks, alcohol, smoothies, etc    Eliminate all alcohol intake     OTHER  Continue to increase consistency and intensity of exercise from current ability to a goal of at least 15 minutes continuously for at least 30 minutes 5 days a week   -  General casual walking such  as walking the dog, etc is activity not exercise   - Speed walking for at least 15 minutes straight may be considered as exercise    Reach and maintain a weight goal of 400 lb or less prior to possible approval for surgery according to Dr. Earley Abide    Make a follow-up appointment with [facility] Dietitian:   As outlined above    Feel free to contact me with questions or concerns via [employer] or email: [email or web address] or call the clinic at [phone]

## 2017-04-22 ENCOUNTER — Other Ambulatory Visit: Payer: Self-pay

## 2017-05-10 ENCOUNTER — Ambulatory Visit (HOSPITAL_BASED_OUTPATIENT_CLINIC_OR_DEPARTMENT_OTHER): Payer: No Typology Code available for payment source

## 2017-05-11 ENCOUNTER — Telehealth (HOSPITAL_BASED_OUTPATIENT_CLINIC_OR_DEPARTMENT_OTHER): Payer: Self-pay | Admitting: Gastroenterology

## 2017-05-11 ENCOUNTER — Encounter (HOSPITAL_BASED_OUTPATIENT_CLINIC_OR_DEPARTMENT_OTHER): Payer: No Typology Code available for payment source

## 2017-05-11 ENCOUNTER — Ambulatory Visit (HOSPITAL_BASED_OUTPATIENT_CLINIC_OR_DEPARTMENT_OTHER): Payer: No Typology Code available for payment source

## 2017-05-11 NOTE — Telephone Encounter (Signed)
Sharepoint Assessment Questions    Estimated body mass index is 63.49 kg/m as calculated from the following:    Height as of 03/25/17: 5\' 9"  (1.753 m).    Weight as of 04/14/17: 429 lb 14.4 oz (195 kg).    HT: 5'9"  WT: 428 LBS  BMI: 63.2    GENERAL QUESTIONS:  Does the patient have sufficient understanding of English? Yes  If NO, which language?    Is the patient able to provide consent? Yes (If no, fill in below)    Legal Guardian                                   DPOA:  NAME:                                           NAME:    Phone:                                           Phone:      Patient Assessment      Have you had a previous Endoscopy? Yes  If yes:  Where? [place] in [place]  When? 2 years ago   Procedure Type? EGD and Colonoscopy    Do you have an allergy to Fentanyl or Versed? No    Have you ever had any problems in the past with sedation (Different from General Anesthesia)? No    Do you use narcotics on a daily basis? No    Does you use oxygen at Home?  No    Are you taking any Anti-coagulant or Anti-Platelet Medications? No    Do you have any bleeding or coagulation disorders? No    Are you Diabetic?  No    Do you have ESRD? No    Do you have ESLD? No    Do you have diagnosed sleep apnea? Yes  If yes, do you use a sleep apnea machine? Yes  If yes, what kind of machine do you use? Cpap machine   If yes, was the patient instructed to bring their CPAP machine with them? Yes      Do you currently have a diagnosis of Congestive Heart Failure or CHF that you are being treated for? No    Do you have a Pacemaker or Defibrillator? No    Are you a difficult IV start? No    Is the patient a male & younger than 7128yrs old? No    Do you have any mobility issues that make it difficult to get onto a stretcher? No    Is this patient scheduled for a colonoscopy?   If yes, do you have less than 2 bowel movements a week?   If yes, do you have limited mobility or are wheelchair bound?   If yes to either, arranged for  revised bowel prep?    Is this an Upper Endoscopy SEDATION Case (EGD, EUS, PEG)?   If yes, are you able to open your mouth fully without any difficulty?   If no, arranged for Anesthesia?     Is the patient scheduled for an ERCP?   If yes, do you have an allergy to contrast?   If  yes, routed TE to Interventional Pool for further evaluation?     Is this patient scheduled for a PEG?   If yes, do you have an allergy to PCN?    If yes, routed TE to Charge Nurse Pool to update Allergies in both EHRs?    Is the patient scheduled for an ALS PEG?    If yes, are you able to remove your CPAP mask on your own without any assistance?    If no, arranged for an ICU bed post procedure?     PROCEDURE        Procedure MD: Dr. Lorin PicketScott Biggins   Procedure Type: EGD  Procedure Date:  05/17/2017       Time: 1130    Procedure Check-In Time: 1030    Patient Teaching  Who received the teaching - Patient? Yes    Was the topic of stopping iron supplements taught? Yes    Was the topic of blood thinners taught?  Yes    Was the topic of diet taught? Yes    Were instructions for taking current medications taught? Yes    Was our transportation policy taught? Yes    What is the name of their driver? Will arrange     How were the procedure preparation instruction materials delivered?  Verbal: Yes  On Paper: No  eCare message: No  Email: Yes    Does this procedure require bowel prep? No  If yes, were instructions for the ordered laxative taught? No  If yes, which RX was prescribed? N/A.    General Notes:

## 2017-05-12 ENCOUNTER — Encounter (HOSPITAL_BASED_OUTPATIENT_CLINIC_OR_DEPARTMENT_OTHER): Payer: No Typology Code available for payment source | Admitting: Registered"

## 2017-05-12 ENCOUNTER — Encounter (HOSPITAL_BASED_OUTPATIENT_CLINIC_OR_DEPARTMENT_OTHER): Payer: No Typology Code available for payment source

## 2017-05-17 ENCOUNTER — Encounter (HOSPITAL_BASED_OUTPATIENT_CLINIC_OR_DEPARTMENT_OTHER): Payer: No Typology Code available for payment source | Admitting: Gastroenterology

## 2017-06-10 ENCOUNTER — Telehealth (HOSPITAL_BASED_OUTPATIENT_CLINIC_OR_DEPARTMENT_OTHER): Payer: Self-pay | Admitting: Clinical

## 2017-06-10 NOTE — Telephone Encounter (Signed)
[facility] Social Work Telephone Note:    This Clinical research associatewriter contacted the patient for a routine telephone check-in 8 weeks after his evaluation visit with this Clinical research associatewriter. The patient is interested in RYGB. Mr. Lance Lindsey stated that he is exercising regularly at the gym and has been losing weight. He stated "everything is going well." He has not yet attended an in-person support group but will do so in the future. He asked this Clinical research associatewriter to e-mail him a list of groups and this Clinical research associatewriter has done so. He states that he is exercising at least 150 minutes per week.  The patient sounded very groggy and did not remember this Clinical research associatewriter but he understands that he should call this Clinical research associatewriter when he has attended a support group. He is scheduled to return to clinic on 06/16/17 to see program RD Su Grandebra Clancy. This Clinical research associatewriter will try and "touch base" in person at that time.

## 2017-06-16 ENCOUNTER — Encounter (HOSPITAL_BASED_OUTPATIENT_CLINIC_OR_DEPARTMENT_OTHER): Payer: Self-pay

## 2017-06-16 ENCOUNTER — Encounter (HOSPITAL_BASED_OUTPATIENT_CLINIC_OR_DEPARTMENT_OTHER): Payer: No Typology Code available for payment source | Admitting: Registered"

## 2017-06-16 NOTE — Progress Notes (Signed)
BARIATRIC NURSE NAVIGATOR    Patient has met the clinical requirements for bariatric test review: No    The following consults/tests were ordered by Dr Bridgett Larsson on 03/25/17:    Consults  Social Work: 04/14/17  Nutrition: 04/14/17  Portions Class: to be scheduled    Tests/Procedures  Screening Labs: not yet received, letter sent to PCP  Sleep Apnea Testing: OSA on CPAP, need compliance documentation  Upper Endoscopy: to be scheduled  H. Pylori: to be checked at time of EGD  pH /Manometry Testing: to be scheduled  Upper GI: to be scheduled    06/16/17  Called patient to review plan: needs to follow up with SW, RD (missed appt today, will reschedule), screening labs, CPAP compliance, EGD, H Pylori, pH/manometry, and UGI. Patient voiced understanding; he states he has all the numbers to call to get tests and consults scheduled, and will do so when his wife is with him to help coordinate.

## 2017-06-22 HISTORY — PX: LAPAROSCOP GASTRIC BYPASS: S2085

## 2017-06-28 ENCOUNTER — Telehealth (HOSPITAL_BASED_OUTPATIENT_CLINIC_OR_DEPARTMENT_OTHER): Payer: Self-pay | Admitting: Clinical

## 2017-06-28 NOTE — Telephone Encounter (Signed)
[facility] Social Work Telephone Note:    Mr. Lance Lindsey left a VM for this writer on 06/28/17 requesting a call back. This Clinical research associatewriter returned the patient's call and Mr. Lance Lindsey stated that he had been trying to reach the patient line in order to cancel his upcoming appointments. This Clinical research associatewriter asked if he had rescheduled and the patient stated that he had not because he was "not really satisfied with certain aspects of the program." He clarified that he wants to have a RYGB and that the medical providers he saw told him he might only be able to have the VSG. This Clinical research associatewriter thanked him for calling and canceling his appointments and encouraged the patient to call if he is interested in our services in the future.

## 2017-06-28 NOTE — Progress Notes (Deleted)
[facility]  [facility] Check [facility]  Clinical Evaluation Note    Primary Care Provider: Junious Dresser, DO    DOB:  1975/08/13    CHIEF COMPLAINT: Medically Supervised Weight Loss for Obesity and related comorbidities. There is a *** month insurance requirement for MSWL prior to this surgery. Today is her/his **** visit for MSWL. This will benefit this patient by reducing insulin resistance, weight, shrink the size of the liver, and help develop a healthier lifestyle to reduce surgical complications and improve post-operative outcomes.    HISTORY OF PRESENT ILLNESS:   Lance Lindsey comes to clinic to follow up for medically supervised weight loss. Since his/her last visit  He continues on a reduced calorie diet consisting of *** calories with *** gm protein and *** gm carbohydrates.  His exercise regime consists of daily activities of daily living and ***.  ***  for *** minutes, *** days per week.    Sleep hygiene Uses CPAP    Obesity-associated co-morbidities:  Sleep Apnea:  CPAP   GERD:  ***  Hyperlipidemia:  ***       Musculoskeletal:  OA    Baseline weight:  03/25/2017: 428.5 pounds    Recent Weights:  06/29/2017:    Interval History:  Since our last visit, Lance Lindsey has lost *** pounds. He has lost *** lb (***% of his body weight) in our program since ***.       PAST MEDICAL HISTORY:   Past Medical History:   Diagnosis Date    Colon polyps     Diverticulitis     History of sleep apnea     Lipidemia     Obesity     Right knee DJD       Patient Active Problem List   Diagnosis    Onychomycosis    Tinea pedis of both feet    BMI 60.0-69.9, adult (HCC)    Right knee DJD    Chronic bilateral low back pain without sciatica    Increased urinary frequency    Family history of diabetes mellitus in first degree relative    Annual physical exam    Nocturia    Routine adult health maintenance    BPH (benign prostatic hyperplasia)    Other muscle spasm    History of high  risk medication treatment    Hyperlipidaemia    Dyspepsia    Dietary counseling and surveillance         PAST SURGICAL HISTORY:   Past Surgical History:   Procedure Laterality Date    COLONOSCOPY          FAMILY AND SOCIAL HISTORY:   family history includes Arthritis in his father, maternal grandfather, and maternal grandmother; Cancer in his maternal grandfather; Diabetes in his father, maternal grandfather, maternal grandmother, and mother; Heart Disease in his father, maternal grandmother, and mother; Hypertension in his father, maternal grandmother, and mother; Kidney Disorder in his maternal grandfather and maternal grandmother; Obesity in his mother.   Social History   Substance Use Topics    Smoking status: Never Smoker    Smokeless tobacco: Never Used    Alcohol use No         ACTIVE MEDS:   Current Outpatient Prescriptions   Medication Sig Dispense Refill    Atorvastatin Calcium 20 MG Oral Tab Take 1 tablet (20 mg) by mouth daily. 90 tablet 3    Clotrimazole-Betamethasone 1-0.05 % External Cream Apply 1 application to affected area on  leg(s) 2 times a day. 45 g 3    Cyclobenzaprine HCl 10 MG Oral Tab Take 1 tablet (10 mg) by mouth 3 times a day as needed for muscle spasms. 24 tablet 1     No current facility-administered medications for this visit.          ALLERGIES:   Review of patient's allergies indicates:  Allergies   Allergen Reactions    Morphine Nausea/Vomiting    Fish Allergy Nausea/Vomiting        REVIEW OF SYSTEMS:   Positive for ***.  Full 14 system review of systems was reviewed with the patient and otherwise negative except as outlined above and noted in HPI.    CONSTITUTIONAL:  No fever, weight loss.  EYES:  No visual loss.  ENT:  No hearing loss, exudate.  CARDIOVASCULAR:  No Palpatations or orthostatsis.  GI:  No melena, hematochezia, hematemesis.  SKIN:  no lesions. No redness or infections  GU:  No dysuria, hematuria.  MUSCULOSKELETAL:  No problems.  NEURO:  No TIA or CVA  symptoms.  ALLERGIC/IMMUNOLOGIC:  No hives or rash.  PSYCHIATRIC:  No suicidal ideations.    PHYSICAL EXAM:  There were no vitals taken for this visit. (no height taken for this visit) (no weight taken for this visit) There is no height or weight on file to calculate BMI.  GENERAL: well-developed, well-nourished, obese male, in no apparent distress.  HEENT: Normocephalic, atraumatic head.  Sclerae are anicteric. Pupils equal, round, and reactive to light. Extraocular muscles are intact.  NECK: Supple nontender neck.  RESPIRATORY:The patient has normal respiratory effort on room air.  ABDOMEN: Nontender, nondistended, obese abdomen. No palpable masses or organomegaly.   SKIN: No rashes, bruises or edema.  NEUROLOGIC: Awake, alert, and oriented times 3. Normal gait and stance.  EXTREMITIES: Warm and well perfused extremities, moves all extremities well.     IMPRESSION:  Lance Lindsey presents today with class *** obesity, There is no height or weight on file to calculate BMI.  and comes to clinic to follow up for medically supervised weight loss.  Lance Lindsey is making *** progress.  We reviewed today that Lance Lindsey needs to focus on ***    He *** not bothered by hunger at this point, and I think it is reasonable for him to continue to aim for about *** calories a day while increasing his protein intake.  I think it will be critical for him to contiinue to increase his physical activity with a goal of at least 150 minutes of aerobic exercise per week.      ASSESSMENT AND PLAN:  1. Weight management, Class *** obesity    We agreed on the following plan:   Participation in intensive behavioral modification program including:   Nutrition:   Calorie goal *** cal/day   Continue small frequent meals, protein w/ each meal   Physical Activity:   Encouraged to work towards a goal of at least 150 min/wk of moderate-vigorous activity   Behavioral Change:   Goal 1-2 lb/wk weight loss   Weight loss surgery: Patient is currently  enrolled and needs ***.          Chronic Disease Follow-Up: Return in 4 weeks as schedule allows.  We did discuss the future schedule of follow up care.    Clinic visit was *** minutes spent in face to face greater than 50% in counseling and education regarding the above.

## 2017-06-29 ENCOUNTER — Encounter (HOSPITAL_BASED_OUTPATIENT_CLINIC_OR_DEPARTMENT_OTHER): Payer: No Typology Code available for payment source | Admitting: Physician Assistant

## 2017-09-30 ENCOUNTER — Encounter (HOSPITAL_BASED_OUTPATIENT_CLINIC_OR_DEPARTMENT_OTHER): Payer: No Typology Code available for payment source | Admitting: Physician Assistant

## 2017-09-30 ENCOUNTER — Encounter (HOSPITAL_BASED_OUTPATIENT_CLINIC_OR_DEPARTMENT_OTHER): Payer: No Typology Code available for payment source | Admitting: Surgery

## 2017-11-10 ENCOUNTER — Other Ambulatory Visit (INDEPENDENT_AMBULATORY_CARE_PROVIDER_SITE_OTHER): Payer: Self-pay | Admitting: Family Medicine

## 2017-11-10 DIAGNOSIS — E782 Mixed hyperlipidemia: Secondary | ICD-10-CM

## 2017-11-10 MED ORDER — ATORVASTATIN CALCIUM 20 MG OR TABS
ORAL_TABLET | ORAL | 0 refills | Status: DC
Start: 2017-11-10 — End: 2019-03-20

## 2017-11-10 NOTE — Telephone Encounter (Signed)
Pending 1 refill and patient informed to make an appointment.

## 2017-12-24 ENCOUNTER — Inpatient Hospital Stay: Payer: Self-pay

## 2017-12-24 LAB — BASIC METABOLIC PANEL
Anion Gap, External: 5 mmol/L — NL (ref 5–16)
BUN: 21 mg/dL — ABNORMAL HIGH (ref 7–18)
CO2, External: 25 mmol/L — NL (ref 21–32)
Calcium, External: 9.3 mg/dL — NL (ref 8.5–10.1)
Chloride, External: 106 mmol/L — NL (ref 98–107)
Creatinine, External: 1.01 mg/dL — NL (ref 0.70–1.30)
Glucose, External: 85 mg/dL — NL (ref 65–99)
Potassium, External: 3.6 mmol/L — NL (ref 3.5–5.1)
Sodium, External: 136 mmol/L — NL (ref 136–145)
eGFR, External: >60 mL/min/{1.73_m2} — NL

## 2017-12-25 LAB — BASIC METABOLIC PANEL
Anion Gap, External: 6 mmol/L — NL (ref 5–16)
BUN: 10 mg/dL — NL (ref 7–18)
CO2, External: 26 mmol/L — NL (ref 21–32)
Calcium, External: 9 mg/dL — NL (ref 8.5–10.1)
Chloride, External: 106 mmol/L — NL (ref 98–107)
Creatinine, External: 0.94 mg/dL — NL (ref 0.70–1.30)
Glucose, External: 91 mg/dL — NL (ref 65–99)
Potassium, External: 3.8 mmol/L — NL (ref 3.5–5.1)
Sodium, External: 138 mmol/L — NL (ref 136–145)
eGFR, External: >60 mL/min/{1.73_m2} — NL

## 2017-12-29 ENCOUNTER — Telehealth (INDEPENDENT_AMBULATORY_CARE_PROVIDER_SITE_OTHER): Payer: Self-pay | Admitting: Family Medicine

## 2017-12-29 ENCOUNTER — Ambulatory Visit (INDEPENDENT_AMBULATORY_CARE_PROVIDER_SITE_OTHER): Payer: No Typology Code available for payment source | Admitting: Family Medicine

## 2017-12-29 NOTE — Telephone Encounter (Signed)
(  TEXTING IS AN OPTION FOR [facility] CLINICS ONLY)  Is this a [facility] clinic? No      RETURN CALL: No call back needed      SUBJECT:  General Message     REASON FOR REQUEST: Care coordination    MESSAGE: Caller states patient wanted them to courtesy call that patient was [facility], gastric bypass surgery.

## 2018-01-13 ENCOUNTER — Inpatient Hospital Stay: Payer: Self-pay

## 2018-01-19 ENCOUNTER — Encounter (INDEPENDENT_AMBULATORY_CARE_PROVIDER_SITE_OTHER): Payer: Self-pay | Admitting: Family Medicine

## 2018-01-19 ENCOUNTER — Ambulatory Visit (INDEPENDENT_AMBULATORY_CARE_PROVIDER_SITE_OTHER): Payer: No Typology Code available for payment source | Admitting: Family Medicine

## 2018-01-19 ENCOUNTER — Ambulatory Visit: Payer: No Typology Code available for payment source | Attending: Family Medicine

## 2018-01-19 VITALS — BP 122/78 | HR 77 | Temp 97.4°F | Ht 70.0 in | Wt 365.0 lb

## 2018-01-19 DIAGNOSIS — Z6841 Body Mass Index (BMI) 40.0 and over, adult: Secondary | ICD-10-CM | POA: Insufficient documentation

## 2018-01-19 DIAGNOSIS — Z833 Family history of diabetes mellitus: Secondary | ICD-10-CM | POA: Insufficient documentation

## 2018-01-19 DIAGNOSIS — E785 Hyperlipidemia, unspecified: Secondary | ICD-10-CM

## 2018-01-19 DIAGNOSIS — Z0000 Encounter for general adult medical examination without abnormal findings: Secondary | ICD-10-CM

## 2018-01-19 LAB — CBC, DIFF
% Basophils: 0 %
% Eosinophils: 2 %
% Immature Granulocytes: 0 %
% Lymphocytes: 35 %
% Monocytes: 8 %
% Neutrophils: 55 %
% Nucleated RBC: 0 %
Absolute Eosinophil Count: 0.14 10*3/uL (ref 0.00–0.50)
Absolute Lymphocyte Count: 2.34 10*3/uL (ref 1.00–4.80)
Basophils: 0.02 10*3/uL (ref 0.00–0.20)
Hematocrit: 45 % (ref 38–50)
Hemoglobin: 13.9 g/dL (ref 13.0–18.0)
Immature Granulocytes: 0.02 10*3/uL (ref 0.00–0.05)
MCH: 26.3 pg — ABNORMAL LOW (ref 27.3–33.6)
MCHC: 30.8 g/dL — ABNORMAL LOW (ref 32.2–36.5)
MCV: 85 fL (ref 81–98)
Monocytes: 0.54 10*3/uL (ref 0.00–0.80)
Neutrophils: 3.64 10*3/uL (ref 1.80–7.00)
Nucleated RBC: 0 10*3/uL
Platelet Count: 179 10*3/uL (ref 150–400)
RBC: 5.28 10*6/uL (ref 4.40–5.60)
RDW-CV: 14.8 % — ABNORMAL HIGH (ref 11.6–14.4)
WBC: 6.7 10*3/uL (ref 4.3–10.0)

## 2018-01-19 LAB — COMPREHENSIVE METABOLIC PANEL
ALT (GPT): 27 U/L (ref 10–64)
AST (GOT): 27 U/L (ref 9–38)
Albumin: 4 g/dL (ref 3.5–5.2)
Alkaline Phosphatase (Total): 87 U/L (ref 36–122)
Anion Gap: 10 (ref 4–12)
Bilirubin (Total): 0.7 mg/dL (ref 0.2–1.3)
Calcium: 9.4 mg/dL (ref 8.9–10.2)
Carbon Dioxide, Total: 29 meq/L (ref 22–32)
Chloride: 99 meq/L (ref 98–108)
Creatinine: 1.01 mg/dL (ref 0.51–1.18)
GFR, Calc, African American: >60 mL/min/{1.73_m2} (ref >59)
GFR, Calc, European American: >60 mL/min/{1.73_m2} (ref >59)
Glucose: 78 mg/dL (ref 62–125)
Potassium: 4 meq/L (ref 3.6–5.2)
Protein (Total): 7.3 g/dL (ref 6.0–8.2)
Sodium: 138 meq/L (ref 135–145)
Urea Nitrogen: 8 mg/dL (ref 8–21)

## 2018-01-19 LAB — THYROID STIMULATING HORMONE: Thyroid Stimulating Hormone: 1.665 u[IU]/mL (ref 0.400–5.000)

## 2018-01-19 LAB — UA WORKUP W/REFLEX CULTURE (NWH)
Bilirubin (Qual), URN: POSITIVE — AB
Glucose Qual, URN: NEGATIVE mg/dL
Leukocyte Esterase, URN: NEGATIVE
Nitrite, URN: NEGATIVE
Occult Blood, URN: NEGATIVE
Specific Gravity, URN: 1.02 g/mL (ref 1.006–1.027)
pH, URN: 6 (ref 5.0–8.0)

## 2018-01-19 LAB — PSA, TOTAL & REFLEXIVE FREE: PSA, Diagnostic/Monitoring: 0.33 ng/mL (ref 0.00–4.00)

## 2018-01-19 LAB — LIPID PANEL
Cholesterol (LDL): 68 mg/dL (ref <130)
Cholesterol/HDL Ratio: 5.5
HDL Cholesterol: 21 mg/dL — ABNORMAL LOW (ref >39)
Non-HDL Cholesterol: 95 mg/dL (ref 0–159)
Total Cholesterol: 116 mg/dL (ref <200)
Triglyceride: 137 mg/dL (ref <150)

## 2018-01-19 LAB — UA MICROSCOPIC W/REFLEX CULTURE
Epith Cells_Renal/Trans,URN: NEGATIVE /HPF
Epith Cells_Squamous, URN: NEGATIVE /LPF
RBC, URN: NEGATIVE /HPF
WBC, URN: NEGATIVE /HPF

## 2018-01-19 LAB — C_REACTIVE PROTEIN: C_Reactive Protein: 19.5 mg/L — ABNORMAL HIGH (ref 0.0–10.0)

## 2018-01-19 NOTE — Progress Notes (Signed)
Loma BostonI, Rashiya Lofland H. Rocko Fesperman, D.O., interviewed and examined the patient while overseeing the documentation performed by my Medical Scribe. I have reviewed and revised as necessary the scribes note and agree with the documented findings and plan of care. Please refer to the scribe's note below for further detailed information regarding the patient encounter and exam.  01/19/2018  11:24 AM.

## 2018-01-19 NOTE — Patient Instructions (Addendum)
It was a pleasure to be your provider today.  If you have additional comments or concerns, please don't hesitate to contact me.     AFTER VISIT SUMMARY:    1. If medications were ordered, please pick up your medications from your pharmacy and take as prescribed. If you have additional medication-specific questions, please ask your pharmacist at time of pick-up.    2. Your labs should be completed within a few days and will be available on-line through E[facility].    3. Complete the Occult FIT stool card, put in outgoing mail to send to the lab. No postage necessary.    Sincerely,     Thomas H. Jansen, DO  Board Certified Family Practice  [phone]    Please be sure you are signed up for e-care, as this will allow easier access to direct communication with my staff and myself. Thanks again.

## 2018-01-19 NOTE — Progress Notes (Signed)
DATE: 01/19/2018     ASSESSMENT:    ICD-10-CM ICD-9-CM    1. Annual physical exam Z00.00 V70.0 CBC with Differential      Comprehensive Metabolic Panel      CRP, high sensitivity      Hemoglobin A1c      Lipid Panel      UA Workup w/Reflex Culture [facility])      Thyroid Stimulating Hormone      Occult Blood By IA, Stool      Psa, Total & Reflexive Free   2. Morbid obesity with BMI of 50.0-59.9, adult (HCC) E66.01 278.01 Comprehensive Metabolic Panel    Z68.43 V85.43 Hemoglobin A1c      Lipid Panel   3. Hyperlipidaemia E78.5 272.4 Lipid Panel   4. Routine adult health maintenance Z00.00 V70.0    5. Family history of diabetes mellitus in first degree relative Z83.3 V18.0 Lipid Panel         PLAN:  Prior EMR records reviewed in EPIC as available and clinically relevant.  Notable physical exam findings: Low line palate, R TM slightly dull. L TM slightly dull, 80 degree shoulder ROM b/l and shoulder IR at the level of T8, scattered skin tags around neck and collar and stretch marks.  Normal male H&P labs ordered.  Continue current medications prescribed.  Medications Ordered this visit: none  Regular aerobic exercise benefits discussed as well as healthy eating habits.  Health maintenance reviewed: Occult fit test ordered    All question answered. Discharge and follow up instructions were discussed with the patient. Pt fully understands and is in agreement with the plan.     CHIEF COMPLAINT:   Chief Complaint   Patient presents with   . Physical     Complete Physical & HCC evaluation and treatment.        HPI:  Lance Lindsey is a 42 year old male who presents for a complete physical examination. Fasting. He has had a gastric bypass surgery on 12/24/2017 and observes some weight loss already.     PROBLEM LIST:  Patient Active Problem List   Diagnosis   . Onychomycosis   . Tinea pedis of both feet   . BMI 60.0-69.9, adult (HCC)   . Right knee DJD   . Chronic bilateral low back pain without sciatica   . Increased urinary  frequency   . Family history of diabetes mellitus in first degree relative   . Annual physical exam   . Nocturia   . Routine adult health maintenance   . BPH (benign prostatic hyperplasia)   . Other muscle spasm   . History of high risk medication treatment   . Hyperlipidaemia   . Dyspepsia   . Dietary counseling and surveillance   . Morbid obesity with BMI of 50.0-59.9, adult [facility])       PAST MEDICAL HISTORY:  Past Medical History:   Diagnosis Date   . Colon polyps    . Diverticulitis    . History of sleep apnea    . Lipidemia    . Obesity    . Right knee DJD        SURGICAL HISTORY:  Past Surgical History:   Procedure Laterality Date   . COLONOSCOPY     . LAPAROSCOP GASTRIC BYPASS  2019       MEDICATIONS:  Outpatient Medications Prior to Visit   Medication Sig Dispense Refill   . atorvastatin 20 MG Oral Tab TAKE ONE TABLET BY MOUTH ONCE  DAILY 60 tablet 0   . Clotrimazole-Betamethasone 1-0.05 % External Cream Apply 1 application to affected area on leg(s) 2 times a day. 45 g 3   . Cyanocobalamin (VITAMIN B12) 500 MCG Oral Tab Take by mouth.     . Cyclobenzaprine HCl 10 MG Oral Tab Take 1 tablet (10 mg) by mouth 3 times a day as needed for muscle spasms. 24 tablet 1   . Multiple Vitamins-Minerals (MULTIVITAMIN ADULT OR) Take by mouth.     Marland Kitchen omeprazole 20 MG Oral CAPSULE DELAYED RELEASE Take 20 mg by mouth.     . ondansetron 4 MG Oral TABLET DISPERSIBLE      . Polyethylene Glycol 3350 (MIRALAX OR) Take by mouth.     . Simethicone (GAS-X OR) Take by mouth.     . Sucralfate 1 g Oral Tab      . Ursodiol 250 MG Oral Tab        No facility-administered medications prior to visit.        ALLERGIES:  Review of patient's allergies indicates:  Allergies   Allergen Reactions   . Morphine Nausea/Vomiting   . Fish Allergy Nausea/Vomiting   . Other (See Comments) Nausea/Vomiting     Sea food and shellfish okay       FAMILY HISTORY:  Family History     Problem (# of Occurrences) Relation (Name,Age of Onset)    Arthritis (3)  Father, Maternal Grandmother, Maternal Grandfather    Cancer (1) Maternal Grandfather    Diabetes (4) Mother, Father, Maternal Grandmother, Maternal Grandfather    Heart Disease (3) Mother, Father, Maternal Grandmother    Hypertension (3) Mother, Father, Maternal Grandmother    Kidney Disorder (2) Maternal Grandmother, Maternal Grandfather    Obesity (1) Mother          SOCIAL HISTORY:  Social History     Socioeconomic History   . Marital status: Not on file     Spouse name: Not on file   . Number of children: Not on file   . Years of education: Not on file   . Highest education level: Not on file   Occupational History   . Not on file   Social Needs   . Financial resource strain: Not on file   . Food insecurity:     Worry: Not on file     Inability: Not on file   . Transportation needs:     Medical: Not on file     Non-medical: Not on file   Tobacco Use   . Smoking status: Never Smoker   . Smokeless tobacco: Never Used   Substance and Sexual Activity   . Alcohol use: No   . Drug use: No   . Sexual activity: Yes     Partners: Female   Lifestyle   . Physical activity:     Days per week: Not on file     Minutes per session: Not on file   . Stress: Not on file   Relationships   . Social connections:     Talks on phone: Not on file     Gets together: Not on file     Attends religious service: Not on file     Active member of club or organization: Not on file     Attends meetings of clubs or organizations: Not on file     Relationship status: Not on file   . Intimate partner violence:     Fear of current or  ex partner: Not on file     Emotionally abused: Not on file     Physically abused: Not on file     Forced sexual activity: Not on file   Other Topics Concern   . Not on file   Social History Narrative    08/31/16: Patient is married, has a son who is 41 years old. 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       REVIEW OF SYSTEMS:  A 14-category review of systems reviewed  with the patient, is otherwise unchanged and is noncontributory.     PHYSICAL EXAM:  BP 122/78   Pulse 77   Temp 97.4 F (36.3 C) (Temporal)   Ht 5\' 10"  (1.778 m)   Wt (!) 365 lb (165.6 kg)   SpO2 98%   BMI 52.37 kg/m     Physical Examination: Vital signs as noted above.    General Appearance and Mental Status: Well developed, morbidly obese  male Oriented x3, behavior is appropriate  HEENT: Head is atraumatic, normocephalic. PERRLA and EOMI. R ear canal clear. L ear canal clear. R TM slightly dull. L TM slightly dull. Nasal mucosa is normal. No septal deviation. Posterior oropharynx normal without erythema or exudate. No pain to palpation over the frontal or maxillary sinuses.  Teeth and gums are normal. Low line palate  NECK: Supple, non-tender to palpation. Normal cervical ROM without pain. No anterior or posterior cervical adenopathy. No supraclavicular adenopathy. No thyromegaly.   RESPIRATORY: Clear to auscultation and percussion without prolonged inspiratory or expiratory phase. No use of accessory muscles of respiration. No end expiratory wheezing.  CARDIOVASCULAR: Regular rate and rhythm with no murmurs, rubs or gallops. No carotid bruits or jugular venous distension. Distal pulses intact in BUE.  LYMPH: There is no cervical, supraclavicular, axillary, or epitrochlea adenopathy. No palpable cords.  No significant pedal edema.  ABDOMEN: Soft and positive bowel sounds in all four quadrants, non tender, non-tympanic, no rebound or guarding, no hepatosplenomegaly.   GENITOURINARY: No ulcerations or skin lesions. No CVA tenderness. No inguinal hernias bilaterally. No perianal lesions. Circumcised, Testes descended x 2, no masses.    EXTREMITIES: Moves all extremities well.  Muscle testing is normal.  MUSCULOSKELETAL:   normal gait and station;  upper and lower extremities show no defects in symmetry, crepitance, tenderness,  masses, or effusion. Full lumbar and thoracic ROM testing without pain, full  touching to ground. No midline or paraspinal tenderness to palpation or percussion. 80 degree shoulder ROM b/l and shoulder IR at the level of T8. Normal squat, duck walk, and heel and toe weight bearing exams. No over-pronation bilaterally, No bunions, no bunionettes.   SKIN: Warm, dry, normal color. Non-pallor, No ecchymosis. Negative for rashes, inflammation, induration. No actinic or seborrheic keratosis. No Nevi. No ephelides. No onychomycosis. Scattered skin tags around neck and collar and stretch marks.   NEURO: Alert and oriented to person, place and time. No focal deficits appreciated.  Normal sensation and DTR's are equal and symmetrical upper and lower extremities 0-1/4 UE/LE. No nystagmus. Negative Tinel's  PSYCH: Dressed appropriately, good eye contact, calm, normal affect, focused, non-anxious.     LAB/IMAGING RESULTS:  Images are interpreted by me, Elon Spannerhomas H. Jansen, DO.   Labs pending.    Office Visit on 02/11/17   1. COMPREHENSIVE METABOLIC PANEL   Result Value Ref Range    Sodium 137 135 - 145 meq/L    Potassium 3.4 (L)  3.6 - 5.2 meq/L    Chloride 103 98 - 108 meq/L    Carbon Dioxide, Total 28 22 - 32 meq/L    Anion Gap 6 4 - 12    Glucose 106 62 - 125 mg/dL    Urea Nitrogen 13 8 - 21 mg/dL    Creatinine 7.82 9.56 - 1.18 mg/dL    Protein (Total) 7.0 6.0 - 8.2 g/dL    Albumin 3.6 3.5 - 5.2 g/dL    Bilirubin (Total) 0.3 0.2 - 1.3 mg/dL    Calcium 9.2 8.9 - 21.3 mg/dL    AST (GOT) 23 9 - 38 U/L    Alkaline Phosphatase (Total) 81 36 - 122 U/L    ALT (GPT) 20 10 - 64 U/L    GFR, Calc, European American >60 >59 mL/min/[1.73_m2]    GFR, Calc, African American >60 >59 mL/min/[1.73_m2]    GFR, Information       Calculated GFR in mL/min/1.73 m2 by MDRD equation.  Inaccurate with changing renal function.  See [email or web address]://depts.[email or web address].it.[email or web address]       Portions of this chart were written by Rachel Bo, Medical Scribe with oversight by Elon Spanner, DO on 01/19/2018  11:10 AM

## 2018-01-20 LAB — HEMOGLOBIN A1C, HPLC: Hemoglobin A1C: 5.1 % (ref 4.0–6.0)

## 2018-01-21 ENCOUNTER — Telehealth (INDEPENDENT_AMBULATORY_CARE_PROVIDER_SITE_OTHER): Payer: Self-pay | Admitting: Family Medicine

## 2018-01-21 NOTE — Telephone Encounter (Addendum)
-----   Message from Junious Dresserhomas Howard Jansen, DO sent at 01/20/2018 10:32 AM PDT -----  Call patient to review slightly abnormal lab work. Borderline anemia noted, with microcytic indices.Ask your Bariatric surgeon if you are allowed to take any iron pills . If YES, start Ferrous gluconate 325 1 po q day with food. Also, low HDL level at 21, but very good cholesterol and LDL levels,. Plan: Continue with Atorvastatin 20 mg q day.    Called and spoke to patient.  Discussed his recent lab results.   He will discuss with his Bariatric Surgeon to see if he can take Ferrous Gluconate.  He will call me back with answer and we will talk more in depth.

## 2018-03-25 ENCOUNTER — Ambulatory Visit: Payer: No Typology Code available for payment source | Attending: Family Medicine

## 2018-03-25 ENCOUNTER — Ambulatory Visit (INDEPENDENT_AMBULATORY_CARE_PROVIDER_SITE_OTHER): Payer: No Typology Code available for payment source | Admitting: Family Medicine

## 2018-03-25 ENCOUNTER — Encounter (INDEPENDENT_AMBULATORY_CARE_PROVIDER_SITE_OTHER): Payer: Self-pay | Admitting: Family Medicine

## 2018-03-25 VITALS — BP 117/68 | HR 78 | Ht 70.0 in | Wt 365.0 lb

## 2018-03-25 DIAGNOSIS — R801 Persistent proteinuria, unspecified: Secondary | ICD-10-CM | POA: Insufficient documentation

## 2018-03-25 DIAGNOSIS — Z6841 Body Mass Index (BMI) 40.0 and over, adult: Secondary | ICD-10-CM

## 2018-03-25 LAB — COMPREHENSIVE METABOLIC PANEL
ALT (GPT): 18 U/L (ref 10–64)
AST (GOT): 18 U/L (ref 9–38)
Albumin: 3.9 g/dL (ref 3.5–5.2)
Alkaline Phosphatase (Total): 96 U/L (ref 36–122)
Anion Gap: 7 (ref 4–12)
Bilirubin (Total): 0.7 mg/dL (ref 0.2–1.3)
Calcium: 9.7 mg/dL (ref 8.9–10.2)
Carbon Dioxide, Total: 29 meq/L (ref 22–32)
Chloride: 103 meq/L (ref 98–108)
Creatinine: 0.94 mg/dL (ref 0.51–1.18)
GFR, Calc, African American: >60 mL/min/{1.73_m2} (ref >59)
GFR, Calc, European American: >60 mL/min/{1.73_m2} (ref >59)
Glucose: 88 mg/dL (ref 62–125)
Potassium: 3.4 meq/L — ABNORMAL LOW (ref 3.6–5.2)
Protein (Total): 7.3 g/dL (ref 6.0–8.2)
Sodium: 139 meq/L (ref 135–145)
Urea Nitrogen: 10 mg/dL (ref 8–21)

## 2018-03-25 LAB — ALBUMIN/CREATININE RATIO, RANDOM URINE
Albumin (Micro), URN: 6.9 mg/dL
Albumin/Creatinine Ratio, URN: 11 mg/g{creat} (ref <30)
Creatinine/Unit, URN: 623 mg/dL

## 2018-03-25 NOTE — Progress Notes (Signed)
Dictation #1  [id number]  [id number]

## 2018-03-25 NOTE — Progress Notes (Signed)
REASON FOR VISIT:  Protein in the urine.    HISTORY OF PRESENT ILLNESS:   The patient has a very interesting history.  He came to me about a year ago, had a very high BMI with a body weight of about 448 pounds.  He had just moved out here from [place], [place] with his wife who, I believe is, working for the [employer] [place].  He gave massive effort towards losing weight, had difficulty and was set up through a bariatric program, and did in fact have bariatric bypass surgery on 12/25/2017.  He was able to lose roughly 50 pounds prior to the surgery, and since the surgery he has lost another 64 pounds.  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  In reviewing previous labs back on 01/19/2018, just about three weeks post-surgical, he had just a trace of protein with some bilirubin in his lab findings, and prior, going back even further, in 08/2016, also had a trace of protein in his urine.  So, this may be what is popping up one more time.  A hemoglobin A1c that was done about a year and a half ago was 5.7, so it is unlikely that that would be the cause.  To my knowledge, he has not have any subsequent hemoglobin A1c's.  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  So, I am going to try to prove that that is not the case.  I will also look at other kidney diseases, and the albuminuria could slightly just be related to the surgery that he had in 12/25/2017.  Otherwise, health history questionnaire is reviewed.  His vitals are noted to be normal, 5 feet 10 inches, 365 with a BMI of 52, but he has definitely dropped weight down since his original 448.    PHYSICAL EXAMINATION:  VITAL SIGNS:  Blood pressure is 117/68 with a pulse of 78.    HEENT (very brief):  Normal.  HEART:  Regular rate and rhythm.  No  S3, S4 gallop, no murmur.  ABDOMEN:  Soft.  Surgical scars are noted.  No CVA tenderness.    IMPRESSION:  1.  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  2.  Status post recent total abdominal bypass surgery.  3.  Approximately 112 -pound weight loss over the past year and a half through a combination of dieting and surgery.    PLAN:  Check chem profile, hemoglobin A1c, and another urine sample for microalbuminuria.  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  All questions were answered.

## 2018-03-28 ENCOUNTER — Telehealth (INDEPENDENT_AMBULATORY_CARE_PROVIDER_SITE_OTHER): Payer: Self-pay | Admitting: Family Medicine

## 2018-03-28 LAB — HEMOGLOBIN A1C, HPLC: Hemoglobin A1C: 4.9 % (ref 4.0–6.0)

## 2018-03-28 NOTE — Telephone Encounter (Addendum)
General Message:    Detailed Message: Pt calling to remind Dr. Leane Para to send results letter stating that he is healthy [facility] for his [facility] paperwork.    Fax #: [phone]    Return Call: General message okay

## 2018-03-29 ENCOUNTER — Telehealth (INDEPENDENT_AMBULATORY_CARE_PROVIDER_SITE_OTHER): Payer: Self-pay | Admitting: Family Medicine

## 2018-03-29 NOTE — Telephone Encounter (Signed)
Was already signed by Dr.Jansen, fax confirmed as going through.    Elenora Fender, CMA, 03/29/2018 1:58 PM

## 2018-03-29 NOTE — Telephone Encounter (Signed)
General Message:    Detailed Message: Pt called re the letter that Dr. Leane Para did for him on 03/29/18. He indicates that this was supposed to go to an [facility]. But the copy they received is not signed by the doctor. Pt requests a signed copy be faxed to [phone] pls.  Return Call: General message okay

## 2018-03-29 NOTE — Telephone Encounter (Signed)
Printed last note for reference for Dr.Jansen to review and forwarded this message to him.    Elenora Fender, CMA, 03/29/2018 7:45 AM

## 2018-03-29 NOTE — Telephone Encounter (Signed)
Letter written, signed and faxed to [facility] @ [phone]

## 2018-03-30 NOTE — Result Encounter Note (Signed)
Patient was informed of labs via letter and he reviewed on eCare.

## 2018-05-14 ENCOUNTER — Other Ambulatory Visit: Payer: Self-pay

## 2019-03-08 ENCOUNTER — Ambulatory Visit (INDEPENDENT_AMBULATORY_CARE_PROVIDER_SITE_OTHER): Payer: 59 | Admitting: Family Medicine

## 2019-03-08 ENCOUNTER — Ambulatory Visit: Payer: 59 | Attending: Family Medicine

## 2019-03-08 ENCOUNTER — Encounter (INDEPENDENT_AMBULATORY_CARE_PROVIDER_SITE_OTHER): Payer: Self-pay | Admitting: Family Medicine

## 2019-03-08 VITALS — BP 122/77 | HR 49 | Temp 98.5°F | Ht 70.0 in | Wt 280.0 lb

## 2019-03-08 DIAGNOSIS — R0789 Other chest pain: Secondary | ICD-10-CM

## 2019-03-08 DIAGNOSIS — Z0000 Encounter for general adult medical examination without abnormal findings: Secondary | ICD-10-CM

## 2019-03-08 DIAGNOSIS — Z6841 Body Mass Index (BMI) 40.0 and over, adult: Secondary | ICD-10-CM

## 2019-03-08 DIAGNOSIS — K921 Melena: Secondary | ICD-10-CM

## 2019-03-08 LAB — CBC, DIFF
% Basophils: 1 %
% Eosinophils: 8 %
% Immature Granulocytes: 0 %
% Lymphocytes: 24 %
% Monocytes: 7 %
% Neutrophils: 60 %
% Nucleated RBC: 0 %
Absolute Eosinophil Count: 0.58 10*3/uL — ABNORMAL HIGH (ref 0.00–0.50)
Absolute Lymphocyte Count: 1.8 10*3/uL (ref 1.00–4.80)
Basophils: 0.09 10*3/uL (ref 0.00–0.20)
Hematocrit: 48 % (ref 38–50)
Hemoglobin: 16.4 g/dL (ref 13.0–18.0)
Immature Granulocytes: 0.02 10*3/uL (ref 0.00–0.05)
MCH: 34.7 pg — ABNORMAL HIGH (ref 27.3–33.6)
MCHC: 33.9 g/dL (ref 32.2–36.5)
MCV: 102 fL — ABNORMAL HIGH (ref 81–98)
Monocytes: 0.52 10*3/uL (ref 0.00–0.80)
Neutrophils: 4.43 10*3/uL (ref 1.80–7.00)
Nucleated RBC: 0 10*3/uL
Platelet Count: 258 10*3/uL (ref 150–400)
RBC: 4.73 10*6/uL (ref 4.40–5.60)
RDW-CV: 14 % (ref 11.6–14.4)
WBC: 7.44 10*3/uL (ref 4.3–10.0)

## 2019-03-08 LAB — COMPREHENSIVE METABOLIC PANEL
ALT (GPT): 29 U/L (ref 10–64)
AST (GOT): 26 U/L (ref 9–38)
Albumin: 3.8 g/dL (ref 3.5–5.2)
Alkaline Phosphatase (Total): 79 U/L (ref 36–122)
Anion Gap: 8 (ref 4–12)
Bilirubin (Total): 0.6 mg/dL (ref 0.2–1.3)
Calcium: 9.3 mg/dL (ref 8.9–10.2)
Carbon Dioxide, Total: 29 meq/L (ref 22–32)
Chloride: 103 meq/L (ref 98–108)
Creatinine: 0.95 mg/dL (ref 0.51–1.18)
Glucose: 77 mg/dL (ref 62–125)
Potassium: 4 meq/L (ref 3.6–5.2)
Protein (Total): 6.6 g/dL (ref 6.0–8.2)
Sodium: 140 meq/L (ref 135–145)
Urea Nitrogen: 14 mg/dL (ref 8–21)
eGFR by CKD-EPI: >60 mL/min/{1.73_m2} (ref >59)

## 2019-03-08 LAB — UA WORKUP W/REFLEX CULTURE (NWH)
Bilirubin (Qual), URN: NEGATIVE
Glucose Qual, URN: NEGATIVE mg/dL
Ketones, URN: NEGATIVE mg/dL
Leukocyte Esterase, URN: NEGATIVE
Nitrite, URN: NEGATIVE
Occult Blood, URN: NEGATIVE
Protein (Alb Semiquant), URN: NEGATIVE mg/dL
Specific Gravity, URN: >1.029 g/mL — ABNORMAL HIGH (ref 1.006–1.027)

## 2019-03-08 LAB — THYROID STIMULATING HORMONE: Thyroid Stimulating Hormone: 0.853 u[IU]/mL (ref 0.400–5.000)

## 2019-03-08 LAB — PSA, TOTAL & REFLEXIVE FREE: PSA, Diagnostic/Monitoring: 0.28 ng/mL (ref 0.00–4.00)

## 2019-03-08 LAB — TROPONIN_I
Troponin_I Interpretation: NORMAL
Troponin_I: <0.03 ng/mL (ref <0.04)

## 2019-03-08 LAB — C_REACTIVE PROTEIN: C_Reactive Protein: 1.1 mg/L (ref 0.0–10.0)

## 2019-03-08 NOTE — Progress Notes (Signed)
DATE: 03/08/2019     ASSESSMENT:    ICD-10-CM    1. Atypical chest pain  R07.89 Lipid Panel     Troponin I     REFERRAL TO CARDIOLOGY   2. Annual physical exam  Z00.00 Occult Blood By IA, Stool     CBC with Differential     Comprehensive Metabolic Panel     CRP, high sensitivity     Hemoglobin A1c     Thyroid Stimulating Hormone     UA Workup w/Reflex Culture [facility]     Testosterone (All Ages)     Psa, Total & Reflexive Free     EKG 12-LEAD   3. Morbid obesity with BMI of 40.0-44.9, adult (HCC)  E66.01     Z68.41    4. Frank blood in stool  K92.1 Occult Blood By IA, Stool         PLAN:  Prior EMR records reviewed in EPIC as available and clinically relevant.  Notable physical exam findings: Low lying palate with edematous uvula. Bilat TM slightly injected, retracted and dull. Low lying palate with edematous uvula. Bradycardia (48 BPM). Abdominal pannus with induration, rashes, oozing lesion.   Regular aerobic exercise benefits discussed as well as healthy eating habits.  CPE normal labs ordered for the future.    Chest Pain  1. Referral to Cardiology for further consultation.  2. Troponin I lab ordered today.   3. EKG 12-lead ordered today with bradycardia, otherwise normal.   4. Recommend use of OTC Omeprazole 20 mg once daily for 4 weeks    All question answered. Discharge and follow up instructions were discussed with the patient. Pt fully understands and is in agreement with the plan.     CHIEF COMPLAINT:   Chief Complaint   Patient presents with    Wellness     Patient reports chest and abdominal pain. About 3 weeks ago noticed "red" in his stool.       HPI:  Lance Lindsey is a 43 year old male who presents for a complete physical examination. Non-fasting.     Pt is also present for chest and abdominal pain. Chest pain felt more severe than chest palpitation, L sided. Pt denies sense of doom, UE pain, or SOB. Onset while he was sitting . Pt reports about 10 episodes, in rise and fall pattern without  exertion activities. Pt denies past hx of heart disease, GERD. Paternal FHx of MI, passed away at age 42. 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 Last stress test reported to be completed on 2008.     PROBLEM LIST:  Patient Active Problem List   Diagnosis    Onychomycosis    Tinea pedis of both feet    BMI 60.0-69.9, adult (HCC)    Right knee DJD    Chronic bilateral low back pain without sciatica    Increased urinary frequency    Family history of diabetes mellitus in first degree relative    Annual physical exam    Nocturia    Routine adult health maintenance    BPH (benign prostatic hyperplasia)    Other muscle spasm    History of high risk medication treatment    Hyperlipidaemia    Dyspepsia    Dietary counseling and surveillance    Morbid obesity with BMI of 50.0-59.9, adult [facility](HCC)       PAST MEDICAL HISTORY:  Past Medical History:   Diagnosis Date    Colon polyps  Diverticulitis     History of sleep apnea     Lipidemia     Obesity     Right knee DJD        SURGICAL HISTORY:  Past Surgical History:   Procedure Laterality Date    COLONOSCOPY      LAPAROSCOP GASTRIC BYPASS  2019       MEDICATIONS:  Outpatient Medications Prior to Visit   Medication Sig Dispense Refill    atorvastatin 20 MG Oral Tab TAKE ONE TABLET BY MOUTH ONCE DAILY 60 tablet 0    Cyanocobalamin (VITAMIN B12) 500 MCG Oral Tab Take by mouth.      Cyclobenzaprine HCl 10 MG Oral Tab Take 1 tablet (10 mg) by mouth 3 times a day as needed for muscle spasms. 24 tablet 1    Multiple Vitamins-Minerals (MULTIVITAMIN ADULT OR) Take by mouth.      omeprazole 20 MG Oral CAPSULE DELAYED RELEASE Take 20 mg by mouth.      ondansetron 4 MG Oral TABLET DISPERSIBLE       Polyethylene Glycol 3350 (MIRALAX OR) Take by mouth.      Simethicone (GAS-X OR) Take by mouth.      Sucralfate 1 g Oral Tab       Ursodiol 250 MG Oral Tab        No facility-administered medications prior to visit.        ALLERGIES:  Review of patient's  allergies indicates:  Allergies   Allergen Reactions    Morphine Nausea/Vomiting    Fish Allergy Nausea/Vomiting    Other (See Comments) Nausea/Vomiting     Sea food and shellfish okay       FAMILY HISTORY:  Family History     Problem (# of Occurrences) Relation (Name,Age of Onset)    Arthritis (3) Father, Maternal Grandmother, Maternal Grandfather    Cancer (1) Maternal Grandfather    Diabetes (4) Mother, Father, Maternal Grandmother, Maternal Grandfather    Heart Disease (3) Mother, Father, Maternal Grandmother    Hypertension (3) Mother, Father, Maternal Grandmother    Kidney Disorder (2) Maternal Grandmother, Maternal Grandfather    Obesity (1) Mother          SOCIAL HISTORY:  Social History     Socioeconomic History    Marital status: Married     Spouse name: Not on file    Number of children: Not on file    Years of education: Not on file    Highest education level: Not on file   Occupational History    Not on file   Social Needs    Financial resource strain: Not on file    Food insecurity     Worry: Not on file     Inability: Not on file    Transportation needs     Medical: Not on file     Non-medical: Not on file   Tobacco Use    Smoking status: Never Smoker    Smokeless tobacco: Never Used   Substance and Sexual Activity    Alcohol use: No    Drug use: No    Sexual activity: Yes     Partners: Female   Lifestyle    Physical activity     Days per week: Not on file     Minutes per session: Not on file    Stress: Not on file   Relationships    Social connections     Talks on phone: Not on file  Gets together: Not on file     Attends religious service: Not on file     Active member of club or organization: Not on file     Attends meetings of clubs or organizations: Not on file     Relationship status: Not on file    Intimate partner violence     Fear of current or ex partner: Not on file     Emotionally abused: Not on file     Physically abused: Not on file     Forced sexual activity: Not  on file   Other Topics Concern    Not on file   Social History Narrative    08/31/16: Patient is married, has a son who is 26 years old. Patient works as an Mining engineer and as a DJ. Recently moved here from [place], [place].       REVIEW OF SYSTEMS:  A 14-category review of systems reviewed with the patient, is otherwise unchanged and is noncontributory.     PHYSICAL EXAM:  BP 122/77    Pulse (!) 49    Temp 98.5 F (36.9 C) (Temporal)    Ht 5\' 10"  (1.778 m)    Wt (!) 280 lb (127 kg)    BMI 40.18 kg/m     Physical Examination: Vital signs as noted above.    General Appearance and Mental Status: Well developed, Morbidly Obese  male Oriented x3, behavior is appropriate  HEENT: Head is atraumatic, normocephalic. R ear canal clear. L ear canal clear. Bilat TM slightly injected, retracted and dull. Nasal mucosa is normal. No septal deviation. Low lying palate with edematous uvula. Posterior oropharynx normal without erythema or exudate. No pain to palpation over the frontal or maxillary sinuses.  Teeth and gums are normal.   NECK: Supple, non-tender to palpation. No anterior or posterior cervical adenopathy. No supraclavicular adenopathy. No thyromegaly.   RESPIRATORY: Clear to auscultation and percussion without prolonged inspiratory or expiratory phase. No use of accessory muscles of respiration. No end expiratory wheezing.  CARDIOVASCULAR: Bradycardia (48 BPM). Regular rhythm with no murmurs, rubs or gallops. No carotid bruits or jugular venous distension. Distal pulses intact in BUE.  LYMPH: There is no cervical, supraclavicular, axillary, or epitrochlea adenopathy. No palpable cords.  No significant pedal edema.  ABDOMEN: Soft and positive bowel sounds in all four quadrants, non tender, non-tympanic, no rebound or guarding, no hepatosplenomegaly. No umbilical hernia.   EXTREMITIES: Moves all extremities well.  Muscle testing is normal.  MUSCULOSKELETAL:   normal gait and station  SKIN: Warm, dry, normal color.  Abdominal pannus with induration, rashes, oozing lesion.   NEURO: Alert and oriented to person, place and time.   PSYCH: Dressed appropriately, good eye contact, calm, normal affect, focused, non-anxious.     LAB/IMAGING RESULTS:  Images are interpreted by me, Karrie Meres, DO.   Labs pending.    Office Visit on 03/25/18   1. Hemoglobin A1c   Result Value Ref Range    Hemoglobin A1C 4.9 4.0 - 6.0 %   2. URINE SCREEN, MICROALBUMINURIA   Result Value Ref Range    Creatinine/Unit, URN 623 mg/dL    Albumin (Micro), URN 6.90 mg/dL    Albumin/Creatinine Ratio, URN 11 <30 mg/g[creat]   3. Comprehensive Metabolic Panel   Result Value Ref Range    Sodium 139 135 - 145 meq/L    Potassium 3.4 (L) 3.6 - 5.2 meq/L    Chloride 103 98 - 108 meq/L    Carbon  Dioxide, Total 29 22 - 32 meq/L    Anion Gap 7 4 - 12    Glucose 88 62 - 125 mg/dL    Urea Nitrogen 10 8 - 21 mg/dL    Creatinine 1.610.94 0.960.51 - 1.18 mg/dL    Protein (Total) 7.3 6.0 - 8.2 g/dL    Albumin 3.9 3.5 - 5.2 g/dL    Bilirubin (Total) 0.7 0.2 - 1.3 mg/dL    Calcium 9.7 8.9 - 04.510.2 mg/dL    AST (GOT) 18 9 - 38 U/L    Alkaline Phosphatase (Total) 96 36 - 122 U/L    ALT (GPT) 18 10 - 64 U/L    GFR, Calc, European American >60 >59 mL/min/[1.73_m2]    GFR, Calc, African American >60 >59 mL/min/[1.73_m2]    GFR, Information       Calculated GFR in mL/min/1.73 m2 by MDRD equation.  Inaccurate with changing renal function.  See http://depts.[email or web address]/labweb/test/bclim/cGFR.html       Portions of this chart were written by Lance Lindsey, Medical Scribe with oversight by Elon Spannerhomas H. Jansen, DO on 03/08/2019  3:28 PM

## 2019-03-08 NOTE — Progress Notes (Signed)
Blood drawn in left arm, on first try and patient handled it well. Lab drawn for multiple labs per Dr. Jansen and approved by Patient.

## 2019-03-08 NOTE — Patient Instructions (Addendum)
It was a pleasure to be your provider today. I am very grateful you chose [facility] for your care. [place] may send you a short survey asking you how we did today and how we can improve as a clinic team. I welcome any suggestions, recommendations and feedback that may help me and my team provide optimal care. If you have additional comments or concerns, please don't hesitate to contact me.     Sincerely,     Karrie Meres, DO  Board Certified Family Practice  [phone]    Please be sure you are signed up for e-care, as this will allow easier access to direct communication with my staff and myself. Thanks again.      AFTER VISIT SUMMARY:    1. If medications were ordered, please pick up your medications from your pharmacy and take as prescribed. If you have additional medication-specific questions, please ask your pharmacist at time of pick-up.    2. Please start taking over-the-counter Omeprazole 20 mg, 1 tablet per day for 4 weeks.

## 2019-03-08 NOTE — Progress Notes (Signed)
I, Cartier Washko D.O., personally performed the services described in this documentation, as scribed by Dong Gyo "Ben" Lee in my presence, and it is both accurate and complete.

## 2019-03-09 LAB — EKG 12 LEAD
Atrial Rate: 50 {beats}/min
P Axis: 5 degrees
P-R Interval: 192 ms
Q-T Interval: 450 ms
QRS Duration: 104 ms
QTC Calculation: 410 ms
R Axis: 57 degrees
T Axis: 37 degrees
Ventricular Rate: 50 {beats}/min

## 2019-03-09 LAB — HEMOGLOBIN A1C, HPLC: Hemoglobin A1C: 5.2 % (ref 4.0–6.0)

## 2019-03-20 ENCOUNTER — Encounter (INDEPENDENT_AMBULATORY_CARE_PROVIDER_SITE_OTHER): Payer: Self-pay | Admitting: Cardiovascular Disease

## 2019-03-20 ENCOUNTER — Ambulatory Visit (INDEPENDENT_AMBULATORY_CARE_PROVIDER_SITE_OTHER): Payer: 59 | Admitting: Cardiovascular Disease

## 2019-03-20 VITALS — BP 122/78 | HR 48 | Ht 70.0 in | Wt 296.0 lb

## 2019-03-20 DIAGNOSIS — Z6841 Body Mass Index (BMI) 40.0 and over, adult: Secondary | ICD-10-CM

## 2019-03-20 DIAGNOSIS — E785 Hyperlipidemia, unspecified: Secondary | ICD-10-CM

## 2019-03-20 DIAGNOSIS — Z8249 Family history of ischemic heart disease and other diseases of the circulatory system: Secondary | ICD-10-CM | POA: Insufficient documentation

## 2019-03-20 DIAGNOSIS — R0789 Other chest pain: Secondary | ICD-10-CM

## 2019-03-20 DIAGNOSIS — R001 Bradycardia, unspecified: Secondary | ICD-10-CM

## 2019-03-20 NOTE — Progress Notes (Signed)
CARDIOLOGY CONSULTATION NOTE    Patient ID: Lance Lindsey is a 43 year old male.  DOB:  Jun 18, 1976    Primary Care Provider: Aurelio Brash, DO  Referring Provider: Aurelio Brash, *  03/20/19  10:12 AM    Chief Complaint   Patient presents with   . New Patient     NP per Dr. Ranelle Oyster; Chest pain and bradycardia on ekg. Pt c/o dizziness "not to often", happened around same time as chest pain, comes and goes. Not currently feeling the chest pain. Pt reports that the chest pain lasted about 2 weeks but was coming and going, this was 3 weeks ago.       HISTORY OF PRESENT ILLNESS:    Lance Lindsey is an 43 year old male with past medical history as listed below and remarkable for morbid obesity status post bariatric surgery.    Patient reports  3 weeks ago had intermittent left-sided chest pressure for about a week.  Had 2 or 3 episodes a day with intensity up to 5-6/10 and lasting for 15-20 seconds and apparently triggered by emotional stress but not precipitated or worsened by exertion.  Chest pressure was associated with palpitations but no diaphoresis, nausea/vomiting, dizziness or syncope.  Despite symptoms, he continue with his daily walks up to 2 miles a day and has not had any recurrence over the last 2 weeks.    An EKG was performed on 03/08/2019 which showed sinus tachycardia at 50 bpm and no evidence of old myocardial infarction or  ischemic ST or T changes.    She has remote history of hyperlipidemia which improved after cardiac surgery and statins were discontinued.    His father died from MI around age 60.      PAST MEDICAL HISTORY/PROBLEM LIST  Patient Active Problem List    Diagnosis Date Noted   . Other chest pain [R07.89] 03/20/2019   . Family history of early CAD [Z82.49] 03/20/2019   . Bradycardia [R00.1] 03/20/2019   . Morbid obesity with BMI of 50.0-59.9, adult ([place]) [E66.01, Z68.43] 01/19/2018   . Dietary counseling and surveillance [Z71.3] 04/14/2017   . Hyperlipidaemia [E78.5]  03/25/2017   . Dyspepsia [R10.13] 03/25/2017   . History of high risk medication treatment [Z92.29] 02/11/2017   . Annual physical exam [G83.66] 09/16/2016   . Nocturia [R35.1] 09/16/2016   . Routine adult health maintenance [Z00.00] 09/16/2016   . BPH (benign prostatic hyperplasia) [N40.0] 09/16/2016   . Other muscle spasm [M62.838] 09/16/2016   . Onychomycosis [B35.1] 08/31/2016   . Tinea pedis of both feet [B35.3] 08/31/2016   . BMI 60.0-69.9, adult ([place]) [Z68.44] 08/31/2016   . Right knee DJD [M17.11] 08/31/2016   . Chronic bilateral low back pain without sciatica [M54.5, G89.29] 08/31/2016   . Increased urinary frequency [R35.0] 08/31/2016   . Family history of diabetes mellitus in first degree relative [Z83.3] 08/31/2016       Past Surgical History:   Procedure Laterality Date   . COLONOSCOPY     . LAPAROSCOP GASTRIC BYPASS  2019       FAMILY HISTORY:  Family History     Problem (# of Occurrences) Relation (Name,Age of Onset)    Arthritis (3) Father, Maternal Grandmother, Maternal Grandfather    Cancer (1) Maternal Grandfather    Diabetes (4) Mother, Father, Maternal Grandmother, Maternal Grandfather    Heart Disease (3) Mother, Father: Died from [place] around age 80, Maternal Grandmother    Hypertension (47) Mother, Father,  Maternal Grandmother    Kidney Disorder (2) Maternal Grandmother, Maternal Grandfather    Obesity (1) Mother          SOCIAL HISTORY:  Social History     Tobacco Use   . Smoking status: Never Smoker   . Smokeless tobacco: Never Used   Substance Use Topics   . Alcohol use: No   . Drug use: No       ALLERGIES:  Morphine, Fish allergy, and Other (see comments)    Current Outpatient Medications   Medication Sig Dispense Refill   . Ergocalciferol (VITAMIN D OR) Take by mouth.     . fluticasone propionate 50 MCG/ACT nasal spray Spray in the nostril.     . IRON OR Take by mouth.     . Multiple Vitamins-Minerals (MULTIVITAMIN ADULT OR) Take by mouth.     . Polyethylene Glycol 3350 (MIRALAX OR) Take by  mouth.     . Simethicone (GAS-X OR) Take by mouth.       No current facility-administered medications for this visit.        REVIEW OF SYSTEMS:  CONSTITUTIONAL:  No fever, weight loss.  EYES:  No visual loss.  ENT:  No hearing loss, exudate.  CARDIOVASCULAR:  See HPI.  GI:  No melena, hematochezia, hematemesis.  SKIN:  No lesions.  GU:  No dysuria, hematuria.  MUSCULOSKELETAL:  No myalgias, no arthralgias.  NEURO:  No TIA or CVA symptoms.  ALLERGIC/IMMUNOLOGIC:  No hives or rash.  PSYCHIATRIC:  No suicidal ideations.    VITALS SIGNS:  BP 122/78   Pulse (!) 48   Ht 5\' 10"  (1.778 m)   Wt (!) 296 lb (134.3 kg)   BMI 42.47 kg/m   Weight: (!) 296 lb (134.3 kg)  PHYSICAL EXAM:  CONSTITUTIONAL: This is a well-nourished male in no distress.   HEAD: Normocephalic, atraumatic.     NECK:  Thyroid is not enlarged. JVP normal  CARDIOVASCULAR: Regular rhythm.  S1, S2 normal.  No S3, S4, no significant murmur rubs or gallop.  PMI at 5th ICS/MCL. Carotid upstrokes are brisk, without bruits.  Abdominal aorta normal to palpation, no bruit.  Femoral pulses normal, no bruits.  Pedal pulses normal bilaterally.  RESPIRATORY:  Respiratory effort is normal.  Lungs are clear to percussion/auscultation.  GASTROINTESTINAL:  Abdomen without masses or tenderness.  No hepatosplenomegaly.  MUSCULOSKELETAL:  Normal gait and muscle strength  EXTREMITIES:  No clubbing or cyanosis.  NEURO/PSYCH:  Oriented x 3 with normal affect. No focal motor or sensory deficit.  SKIN:  Warm and dry, no lesions.    DIAGNOSTIC TESTS/LABS:   Lab Results   Component Value Date    CL 103 03/08/2019    CO2 29 03/08/2019    BUN 14 03/08/2019    CREATININE 0.95 03/08/2019    GLUCOSE 77 03/08/2019     Lab Results   Component Value Date    WBC 7.44 03/08/2019    RBC 4.73 03/08/2019    MCV 102 (H) 03/08/2019    MCH 34.7 (H) 03/08/2019    MCHC 33.9 03/08/2019     No results found for: INR  Lab Results   Component Value Date    HDL 21 (L) 01/19/2018    LDL 68 01/19/2018      No components found for: HGBA1C  No results found for: MG  No results found for: INR  No components found for: TROPONINI, TROPONINT  TSH:   Lab Results   Component Value  Date    TSH 0.853 03/08/2019         ASSESSMENT AND PLAN:    Other chest pain  Family history of early CAD  Presented with chest pain is atypical with no exertional component.  Baseline EKG is normal.  However, his family history of early CAD is concerning.  Will screen with treadmill stress test.  - REFERRAL TO CARDIAC MONITORING    Sinus bradycardia:  Asymptomatic.  Recent labs and TSH were within normal limits.    Hyperlipidaemia  Lipid profile improved after bariatric surgery and currently off statins.    Morbid obesity with BMI of 50.0-59.9, adult [facility](HCC)    Plan:  We will perform treadmill stress test.  Follow-up with results.        Electronically signed by Lars Massonscar V Henya Aguallo, MD, [facility]  03/20/2019  10:12 AM  Note:  This document was created using speech recognition technology and may contain wrong word or "sound-alike" substitutions. Please contact Dr. Dolores PattyBailon if there is any question or concern.    CC:   Junious DresserJansen, Thomas Howard, DO  Junious DresserJansen, Thomas Howard, *

## 2019-03-23 ENCOUNTER — Other Ambulatory Visit (HOSPITAL_BASED_OUTPATIENT_CLINIC_OR_DEPARTMENT_OTHER): Payer: Self-pay | Admitting: Family Medicine

## 2019-03-23 DIAGNOSIS — Z01812 Encounter for preprocedural laboratory examination: Secondary | ICD-10-CM

## 2019-03-23 DIAGNOSIS — Z20828 Contact with and (suspected) exposure to other viral communicable diseases: Secondary | ICD-10-CM

## 2019-03-27 ENCOUNTER — Encounter (HOSPITAL_BASED_OUTPATIENT_CLINIC_OR_DEPARTMENT_OTHER): Payer: 59

## 2019-03-30 ENCOUNTER — Encounter (HOSPITAL_BASED_OUTPATIENT_CLINIC_OR_DEPARTMENT_OTHER): Payer: 59

## 2019-04-10 ENCOUNTER — Other Ambulatory Visit: Payer: Self-pay

## 2019-05-19 ENCOUNTER — Inpatient Hospital Stay: Payer: Self-pay

## 2020-01-18 ENCOUNTER — Emergency Department (HOSPITAL_BASED_OUTPATIENT_CLINIC_OR_DEPARTMENT_OTHER)
Admission: EM | Admit: 2020-01-18 | Discharge: 2020-01-18 | Disposition: A | Discharge status: Home / Self Care | Payer: 59 | Attending: Emergency Medicine | Admitting: Emergency Medicine

## 2020-01-18 ENCOUNTER — Emergency Department (HOSPITAL_BASED_OUTPATIENT_CLINIC_OR_DEPARTMENT_OTHER): Payer: 59

## 2020-01-18 ENCOUNTER — Other Ambulatory Visit: Payer: Self-pay

## 2020-01-18 ENCOUNTER — Encounter (HOSPITAL_BASED_OUTPATIENT_CLINIC_OR_DEPARTMENT_OTHER): Payer: Self-pay | Admitting: Emergency Medicine

## 2020-01-18 DIAGNOSIS — M79662 Pain in left lower leg: Secondary | ICD-10-CM | POA: Diagnosis not present

## 2020-01-18 DIAGNOSIS — G8929 Other chronic pain: Secondary | ICD-10-CM | POA: Diagnosis not present

## 2020-01-18 DIAGNOSIS — M25562 Pain in left knee: Secondary | ICD-10-CM | POA: Diagnosis present

## 2020-01-18 MED ORDER — ENOXAPARIN SODIUM 150 MG/ML ~~LOC~~ SOLN
135.0000 mg | Freq: Once | SUBCUTANEOUS | Status: AC
  Administered 2020-01-18: 135 mg via SUBCUTANEOUS
  Filled 2020-01-18: qty 1

## 2020-01-18 MED ORDER — APIXABAN 2.5 MG PO TABS: 10.0000 mg | ORAL_TABLET | Freq: Once | ORAL | Status: DC

## 2020-01-18 NOTE — Discharge Instructions (Addendum)
I want you to continue to wear your brace until directed by your doctor. Make sure to come back tomorrow for your DVT study at 10am, if this is positive someone will write your prescription for a blood thinner as we spoke about. If you have any worsening pain, chest pain, shortness of breath, redness or warmth on your leg you need to come back to the emergency department. If you cannot go to your Ortho appointment I have referred you to the Dr. Jordan Likes, call him if you cannot get in with your Ortho doctor. I hope you feel better!

## 2020-01-18 NOTE — ED Triage Notes (Signed)
L calf and knee pain x 1 month, worsening over the past few days. No known injury.

## 2020-01-18 NOTE — ED Provider Notes (Signed)
[facility] EMERGENCY DEPARTMENT Provider Note   CSN: [id number] Arrival date & time: 01/18/20  1512     History Chief Complaint  Patient presents with  . Leg Pain    Gary Hickman is a 44 y.o. male with no pertinent past medical history the presents the emergency department today for left knee pain and left calf pain for the past month.  Denies any traumatic falls or injury to this area.  States that left calf pain is constant, worse when he walks or sits.  Denies any chest pain or shortness of breath.  Also states that knee pain has been constant for over one month, also has noticed that his knee and upper thigh have been swollen which is intermittent.  Swelling increases and decreases throughout the day.  Patient does have orthopedic appointment next week for this.  Patient had bariatric surgery 2 years ago, no other recent surgeries.  No fevers, chills, history of diabetes.  No rashes.  Patient has no previous DVTs, PEs, history of clotting disorder, cancer history.  Denies any numbness or tingling.  Denies any weakness.  States that he did have a long car ride about 2 weeks ago, when traveling down from [place].  States that he was having the pain 2 weeks before the car ride.  Has been able to walk on his leg.  No hip or back pain.  Has not taken anything for this.  Has been able to go to the gym and exercise through this. HPI     History reviewed. No pertinent past medical history.  There are no problems to display for this patient.   Past Surgical History:  Procedure Laterality Date  . BARIATRIC SURGERY         No family history on file.  Social History   Tobacco Use  . Smoking status: Never Smoker  . Smokeless tobacco: Never Used  Substance Use Topics  . Alcohol use: Not Currently  . Drug use: Not on file    Home Medications Prior to Admission medications   Not on File    Allergies    Morphine and related  Review of Systems   Review of Systems    Constitutional: Negative for diaphoresis, fatigue and fever.  Eyes: Negative for visual disturbance.  Respiratory: Negative for shortness of breath.   Cardiovascular: Positive for leg swelling. Negative for chest pain.  Gastrointestinal: Negative for nausea and vomiting.  Musculoskeletal: Positive for arthralgias and myalgias. Negative for back pain and gait problem.  Skin: Negative for color change, pallor, rash and wound.  Neurological: Negative for syncope, weakness, light-headedness, numbness and headaches.  Psychiatric/Behavioral: Negative for behavioral problems and confusion.    Physical Exam Updated Vital Signs BP 120/77 (BP Location: Right Arm)   Pulse 53   Temp 98.5 F (36.9 C) (Oral)   Resp 16   Ht 5\' 9"  (1.753 m)   Wt (!) 136.1 kg   SpO2 99%   BMI 44.30 kg/m   Physical Exam Constitutional:      General: He is not in acute distress.    Appearance: Normal appearance. He is not ill-appearing, toxic-appearing or diaphoretic.  HENT:     Mouth/Throat:     Mouth: Mucous membranes are moist.     Pharynx: Oropharynx is clear.  Eyes:     General: No scleral icterus.    Extraocular Movements: Extraocular movements intact.     Pupils: Pupils are equal, round, and reactive to light.  Cardiovascular:  Rate and Rhythm: Normal rate and regular rhythm.     Pulses: Normal pulses.     Heart sounds: Normal heart sounds.  Pulmonary:     Effort: Pulmonary effort is normal. No respiratory distress.     Breath sounds: Normal breath sounds. No stridor. No wheezing, rhonchi or rales.  Chest:     Chest wall: No tenderness.  Abdominal:     General: Abdomen is flat. There is no distension.     Palpations: Abdomen is soft.     Tenderness: There is no abdominal tenderness. There is no guarding or rebound.  Musculoskeletal:        General: Tenderness present. No swelling. Normal range of motion.     Cervical back: Normal range of motion and neck supple. No rigidity.     Right  lower leg: No edema.     Left lower leg: No edema.     Comments: Left : normal range of motion throughout left leg.  Tenderness noted in knee and back of calf.  No true lower leg swelling, minimal swelling around knee.  Normal laxity tests, is able to walk on it. Normal leg raise.  No warmth or erythema to knee or to lower extremity.  No rashes noted. Normal strength to hip, knee, foot.  No hip tenderness.  No signs of infection.  Normal sensation throughout.  PT DP pulses 2+.  Right side normal.  No midline tenderness to cervical, thoracic, lumbar spine.  Skin:    General: Skin is warm and dry.     Capillary Refill: Capillary refill takes less than 2 seconds.     Coloration: Skin is not pale.  Neurological:     General: No focal deficit present.     Mental Status: He is alert and oriented to person, place, and time.     Cranial Nerves: No cranial nerve deficit.     Sensory: No sensory deficit.     Motor: No weakness.     Gait: Gait normal.  Psychiatric:        Mood and Affect: Mood normal.        Behavior: Behavior normal.     ED Results / Procedures / Treatments   Labs (all labs ordered are listed, but only abnormal results are displayed) Labs Reviewed - No data to display  EKG None  Radiology DG Knee Complete 4 Views Left  Result Date: 01/18/2020 CLINICAL DATA:  44 year old male with left knee pain. EXAM: LEFT KNEE - COMPLETE 4+ VIEW COMPARISON:  None. FINDINGS: There is no acute fracture or dislocation. The bones are well mineralized. There is moderate arthritic changes of the knee with tricompartmental narrowing and spurring, advanced for age. There is a small suprapatellar effusion. The soft tissues are unremarkable. IMPRESSION: 1. No acute fracture or dislocation. 2. Moderate arthritic changes. Electronically Signed   By: Elgie Collard M.D.   On: 01/18/2020 18:04    Procedures Procedures (including critical care time)  Medications Ordered in ED Medications    enoxaparin (LOVENOX) injection 135 mg (135 mg Subcutaneous Given 01/18/20 1758)    ED Course  I have reviewed the triage vital signs and the nursing notes.  Pertinent labs & imaging results that were available during my care of the patient were reviewed by me and considered in my medical decision making (see chart for details).    MDM Rules/Calculators/A&P  Gary Hickman is a 44 y.o. male with no pertinent past medical history the presents the emergency department today for left knee pain and left calf pain for the past month.  No signs of septic joint, patient is afebrile without any erythema or warmth on knee.No signs of cellulitis, unlikely for patient to have DVT.  No lower leg swelling.  However since he states that there is some subjective swelling that appears throughout the day and since there is minimal swelling in the knee, will give prophylaxis DVT medications at this time and have patient do DVT study in the morning since we do not have ultrasound at this time. Nursing did schedule this for 10am tomorrow. Patient agreeable for this and does want anticoagulation prophylaxsis this at this time.  Plain films of knee do show arthritic changes, no acute dislocations or fractures, there is some minimal edema in the knee.  Patient does not have any traumatic injury, no signs of septic joint.  Did discuss with Dr. Lockie Mola who does not think that we need to tap this at this time.  Did discuss that if this were the blood the anticoagulant could worsen this, patient expressed understanding.  Did speak to pharmacy, Barbara Cower, and he recommended 135 mg of Lovenox at this time.  Barbara Cower states that if he was to have DVT, then he would need to be put on warfarin due to bypass surgery two years ago.   I think that pain is coming from arthritis from knee.  Patient does have Ortho follow-up next week, will give knee brace at this time and have him follow-up with Ortho.  Doubt need for  further emergent work up at this time. I explained the diagnosis and have given explicit precautions to return to the ER including for any other new or worsening symptoms. The patient understands and accepts the medical plan as it's been dictated and I have answered their questions. Discharge instructions concerning home care and prescriptions have been given. The patient is STABLE and is discharged to home in good condition.  I discussed this case with my attending physician who cosigned this note including patient's presenting symptoms, physical exam, and planned diagnostics and interventions. Attending physician stated agreement with plan or made changes to plan which were implemented.   Final Clinical Impression(s) / ED Diagnoses Final diagnoses:  Pain of left calf  Chronic pain of left knee    Rx / DC Orders ED Discharge Orders    None       Farrel Gordon, PA-C 01/18/20 1905    Virgina Norfolk, DO 01/18/20 2139

## 2020-01-19 ENCOUNTER — Ambulatory Visit (HOSPITAL_BASED_OUTPATIENT_CLINIC_OR_DEPARTMENT_OTHER)
Admission: RE | Admit: 2020-01-19 | Discharge: 2020-01-19 | Disposition: A | Discharge status: Home / Self Care | Payer: 59 | Source: Ambulatory Visit | Attending: Emergency Medicine | Admitting: Emergency Medicine

## 2020-01-19 ENCOUNTER — Encounter (HOSPITAL_BASED_OUTPATIENT_CLINIC_OR_DEPARTMENT_OTHER): Payer: Self-pay

## 2020-01-19 ENCOUNTER — Ambulatory Visit (HOSPITAL_BASED_OUTPATIENT_CLINIC_OR_DEPARTMENT_OTHER): Admit: 2020-01-19 | Payer: 59

## 2020-01-19 ENCOUNTER — Telehealth (HOSPITAL_BASED_OUTPATIENT_CLINIC_OR_DEPARTMENT_OTHER): Payer: Self-pay | Admitting: Emergency Medicine

## 2020-01-19 DIAGNOSIS — R600 Localized edema: Secondary | ICD-10-CM | POA: Diagnosis not present

## 2020-01-19 DIAGNOSIS — M79605 Pain in left leg: Secondary | ICD-10-CM | POA: Insufficient documentation

## 2020-01-19 NOTE — Telephone Encounter (Signed)
PT needs order for LE doppler this AM.

## 2020-01-23 ENCOUNTER — Ambulatory Visit: Payer: 59 | Admitting: Family Medicine

## 2020-01-23 ENCOUNTER — Encounter: Payer: Self-pay | Admitting: Family Medicine

## 2020-01-23 ENCOUNTER — Ambulatory Visit: Payer: Self-pay

## 2020-01-23 ENCOUNTER — Other Ambulatory Visit: Payer: Self-pay

## 2020-01-23 VITALS — BP 129/86 | HR 74 | Ht 69.0 in | Wt 300.0 lb

## 2020-01-23 DIAGNOSIS — S76312A Strain of muscle, fascia and tendon of the posterior muscle group at thigh level, left thigh, initial encounter: Secondary | ICD-10-CM

## 2020-01-23 DIAGNOSIS — M79605 Pain in left leg: Secondary | ICD-10-CM | POA: Diagnosis not present

## 2020-01-23 HISTORY — DX: Pain in left leg: M79.605

## 2020-01-23 MED ORDER — PREDNISONE 5 MG PO TABS
ORAL_TABLET | ORAL | 0 refills | Status: DC
Start: 2020-01-23 — End: 2020-05-11

## 2020-01-23 MED FILL — predniSONE 5 MG TABS: 5 | 6 days supply | Qty: 21 | Fill #0

## 2020-01-23 NOTE — Patient Instructions (Signed)
Nice to meet you Please try compression  Please try the exercises   Please send me a message in MyChart with any questions or updates.  Please see me back in 4 weeks .   --Dr. Annais Crafts  

## 2020-01-23 NOTE — Progress Notes (Signed)
Gary Hickman - 44 y.o. male MRN [id number]  Date of birth: [birth year]  SUBJECTIVE:  Including CC & ROS.  Chief Complaint  Patient presents with  . Knee Pain    left x 1 month     Gary Hickman is a 44 y.o. male that is presenting with left lateral leg pain.  Pain is been ongoing since his drive across country.  He traveled from [place].  He was evaluated in the emergency department and a Doppler was shown to be negative for blood clot.  The pain is occurring over the distal hamstring and lateral gastrocnemius.  No history of injury.  No history of surgery.  Pain is worse with sitting..  Independent review of the left knee x-ray from 7/29 shows letter degenerative changes.   Review of Systems See HPI   HISTORY: Past Medical, Surgical, Social, and Family History Reviewed & Updated per EMR.   Pertinent Historical Findings include:  No past medical history on file.  Past Surgical History:  Procedure Laterality Date  . BARIATRIC SURGERY      No family history on file.  Social History   Socioeconomic History  . Marital status: Married    Spouse name: Not on file  . Number of children: Not on file  . Years of education: Not on file  . Highest education level: Not on file  Occupational History  . Not on file  Tobacco Use  . Smoking status: Never Smoker  . Smokeless tobacco: Never Used  Substance and Sexual Activity  . Alcohol use: Not Currently  . Drug use: Not on file  . Sexual activity: Not on file  Other Topics Concern  . Not on file  Social History Narrative  . Not on file   Social Determinants of Health   Financial Resource Strain:   . Difficulty of Paying Living Expenses:   Food Insecurity:   . Worried About Programme researcher, broadcasting/film/video in the Last Year:   . Barista in the Last Year:   Transportation Needs:   . Freight forwarder (Medical):   Marland Kitchen Lack of Transportation (Non-Medical):   Physical Activity:   . Days of Exercise per Week:   . Minutes of  Exercise per Session:   Stress:   . Feeling of Stress :   Social Connections:   . Frequency of Communication with Friends and Family:   . Frequency of Social Gatherings with Friends and Family:   . Attends Religious Services:   . Active Member of Clubs or Organizations:   . Attends Banker Meetings:   Marland Kitchen Marital Status:   Intimate Partner Violence:   . Fear of Current or Ex-Partner:   . Emotionally Abused:   Marland Kitchen Physically Abused:   . Sexually Abused:      PHYSICAL EXAM:  VS: BP 129/86   Pulse 74   Ht 5\' 9"  (1.753 m)   Wt 300 lb (136.1 kg)   BMI 44.30 kg/m  Physical Exam Gen: NAD, alert, cooperative with exam, well-appearing MSK:  Left leg: Tenderness to palpation over the lateral distal hamstring. Pain with knee flexion. Mild effusion of the knee. Normal plantar flexion dorsiflexion. Neurovascularly intact  Limited ultrasound: Left leg:  Small effusion with the suprapatellar pouch. No obvious abnormality of the hamstring over the lateral distal component.  There is hyperemia in the subcutaneous with no specific defect. No changes of the lateral gastrocnemius.  Summary: No specific abnormality of the area of his pain.  Ultrasound and interpretation by Clare Gandy, MD    ASSESSMENT & PLAN:   Left leg pain Unclear if his leg pain is associated with the vascular changes where he localized the pain.  These changes are nonspecific and in the subcutaneous.  Or if this is more radiculopathy.  He does have degenerative change of the knee but seems unassociated with his pain. -Counseled on supportive care. -Prednisone. -Could consider gabapentin. -May need to consider physical therapy.

## 2020-01-23 NOTE — Assessment & Plan Note (Signed)
Unclear if his leg pain is associated with the vascular changes where he localized the pain.  These changes are nonspecific and in the subcutaneous.  Or if this is more radiculopathy.  He does have degenerative change of the knee but seems unassociated with his pain. -Counseled on supportive care. -Prednisone. -Could consider gabapentin. -May need to consider physical therapy.

## 2020-02-22 ENCOUNTER — Ambulatory Visit: Payer: 59 | Admitting: Family Medicine

## 2020-02-27 ENCOUNTER — Encounter: Payer: Self-pay | Admitting: Family Medicine

## 2020-02-27 ENCOUNTER — Ambulatory Visit: Payer: Self-pay

## 2020-02-27 ENCOUNTER — Other Ambulatory Visit: Payer: Self-pay

## 2020-02-27 ENCOUNTER — Ambulatory Visit: Payer: BC Managed Care – PPO | Admitting: Family Medicine

## 2020-02-27 VITALS — BP 125/81 | HR 54 | Ht 69.0 in | Wt 300.0 lb

## 2020-02-27 DIAGNOSIS — M25462 Effusion, left knee: Secondary | ICD-10-CM | POA: Insufficient documentation

## 2020-02-27 HISTORY — DX: Effusion, left knee: M25.462

## 2020-02-27 MED ORDER — TRIAMCINOLONE ACETONIDE 40 MG/ML IJ SUSP
40.0000 mg | Freq: Once | INTRAMUSCULAR | Status: AC
  Administered 2020-02-27: 40 mg via INTRA_ARTICULAR

## 2020-02-27 NOTE — Progress Notes (Signed)
Gary Hickman - 44 y.o. male MRN [id number]  Date of birth: November 26, 1975  SUBJECTIVE:  Including CC & ROS.  Chief Complaint  Patient presents with  . Follow-up    left hamstring    Gary Hickman Stage is a 44 y.o. male that is presenting with ongoing left knee and leg pain.  Did get some improvement with the prednisone.  He still notices the pain with walking downstairs.  Initially denied any specific injury or history of similar pain..   Review of Systems See HPI   HISTORY: Past Medical, Surgical, Social, and Family History Reviewed & Updated per EMR.   Pertinent Historical Findings include:  No past medical history on file.  Past Surgical History:  Procedure Laterality Date  . BARIATRIC SURGERY      No family history on file.  Social History   Socioeconomic History  . Marital status: Married    Spouse name: Not on file  . Number of children: Not on file  . Years of education: Not on file  . Highest education level: Not on file  Occupational History  . Not on file  Tobacco Use  . Smoking status: Never Smoker  . Smokeless tobacco: Never Used  Substance and Sexual Activity  . Alcohol use: Not Currently  . Drug use: Not on file  . Sexual activity: Not on file  Other Topics Concern  . Not on file  Social History Narrative  . Not on file   Social Determinants of Health   Financial Resource Strain:   . Difficulty of Paying Living Expenses: Not on file  Food Insecurity:   . Worried About Programme researcher, broadcasting/film/video in the Last Year: Not on file  . Ran Out of Food in the Last Year: Not on file  Transportation Needs:   . Lack of Transportation (Medical): Not on file  . Lack of Transportation (Non-Medical): Not on file  Physical Activity:   . Days of Exercise per Week: Not on file  . Minutes of Exercise per Session: Not on file  Stress:   . Feeling of Stress : Not on file  Social Connections:   . Frequency of Communication with Friends and Family: Not on file  . Frequency of  Social Gatherings with Friends and Family: Not on file  . Attends Religious Services: Not on file  . Active Member of Clubs or Organizations: Not on file  . Attends Banker Meetings: Not on file  . Marital Status: Not on file  Intimate Partner Violence:   . Fear of Current or Ex-Partner: Not on file  . Emotionally Abused: Not on file  . Physically Abused: Not on file  . Sexually Abused: Not on file     PHYSICAL EXAM:  VS: BP 125/81   Pulse (!) 54   Ht 5\' 9"  (1.753 m)   Wt 300 lb (136.1 kg)   BMI 44.30 kg/m  Physical Exam Gen: NAD, alert, cooperative with exam, well-appearing MSK:  Left knee: Effusion. Normal range of motion. No instability. Normal strength resistance. Neurovascularly intact   Aspiration/Injection Procedure Note Glade Strausser January 18, 1976  Procedure: Injection Indications: Left knee pain  Procedure Details Consent: Risks of procedure as well as the alternatives and risks of each were explained to the (patient/caregiver).  Consent for procedure obtained. Time Out: Verified patient identification, verified procedure, site/side was marked, verified correct patient position, special equipment/implants available, medications/allergies/relevent history reviewed, required imaging and test results available.  Performed.  The area was cleaned with iodine  and alcohol swabs.    The left knee superior lateral suprapatellar pouch was injected using 1 cc's of 40 mg Kenalog and 4 cc's of 0.25% bupivacaine with a 22 1 1/2" needle.  Ultrasound was used. Images were obtained in long views showing the injection.     A sterile dressing was applied.  Patient did tolerate procedure well.     ASSESSMENT & PLAN:   Knee effusion, left Unclear if this is more of an inflammatory process.  He does have hyperechoic debris observed in the effusion of the suprapatellar pouch.  Had no initial inciting event or trauma.  No history of similar pain.  Seems less likely  for radiculopathy. -Counseled on supportive care. -Injection. -Could consider a course of anti-inflammatory. -Could consider lab work.

## 2020-02-27 NOTE — Addendum Note (Signed)
Addended by: Kathi Simpers F on: 02/27/2020 11:47 AM   Modules accepted: Orders

## 2020-02-27 NOTE — Patient Instructions (Signed)
Good to see you Please try ice  Please try the brace or the ace wrap   Please let me know in 3-4 days if your pain isn't improved Please send me a message in MyChart with any questions or updates.  Please see me back in 4 weeks.   --Dr. Jordan Likes

## 2020-02-27 NOTE — Assessment & Plan Note (Signed)
Unclear if this is more of an inflammatory process.  He does have hyperechoic debris observed in the effusion of the suprapatellar pouch.  Had no initial inciting event or trauma.  No history of similar pain.  Seems less likely for radiculopathy. -Counseled on supportive care. -Injection. -Could consider a course of anti-inflammatory. -Could consider lab work.

## 2020-04-25 ENCOUNTER — Other Ambulatory Visit: Payer: Self-pay

## 2020-05-11 ENCOUNTER — Encounter (HOSPITAL_BASED_OUTPATIENT_CLINIC_OR_DEPARTMENT_OTHER): Payer: Self-pay | Admitting: Emergency Medicine

## 2020-05-11 ENCOUNTER — Emergency Department (HOSPITAL_BASED_OUTPATIENT_CLINIC_OR_DEPARTMENT_OTHER)
Admission: EM | Admit: 2020-05-11 | Discharge: 2020-05-11 | Disposition: A | Discharge status: Home / Self Care | Payer: BC Managed Care – PPO | Attending: Emergency Medicine | Admitting: Emergency Medicine

## 2020-05-11 ENCOUNTER — Other Ambulatory Visit: Payer: Self-pay

## 2020-05-11 ENCOUNTER — Emergency Department (HOSPITAL_BASED_OUTPATIENT_CLINIC_OR_DEPARTMENT_OTHER): Payer: BC Managed Care – PPO

## 2020-05-11 DIAGNOSIS — M5136 Other intervertebral disc degeneration, lumbar region: Secondary | ICD-10-CM

## 2020-05-11 DIAGNOSIS — S59912A Unspecified injury of left forearm, initial encounter: Secondary | ICD-10-CM | POA: Diagnosis not present

## 2020-05-11 DIAGNOSIS — S0990XA Unspecified injury of head, initial encounter: Secondary | ICD-10-CM | POA: Diagnosis not present

## 2020-05-11 DIAGNOSIS — S3992XA Unspecified injury of lower back, initial encounter: Secondary | ICD-10-CM | POA: Insufficient documentation

## 2020-05-11 MED ORDER — IBUPROFEN 800 MG PO TABS
800.0000 mg | ORAL_TABLET | Freq: Three times a day (TID) | ORAL | 0 refills | Status: DC
Start: 2020-05-11 — End: 2021-09-17

## 2020-05-11 MED ORDER — METHOCARBAMOL 500 MG PO TABS
500.0000 mg | ORAL_TABLET | Freq: Two times a day (BID) | ORAL | 0 refills | Status: DC
Start: 2020-05-11 — End: 2021-09-17

## 2020-05-11 MED ORDER — ACETAMINOPHEN 500 MG PO TABS
1000.0000 mg | ORAL_TABLET | Freq: Once | ORAL | Status: AC
  Administered 2020-05-11: 1000 mg via ORAL
  Filled 2020-05-11: qty 2

## 2020-05-11 MED ORDER — IBUPROFEN 800 MG PO TABS
800.0000 mg | ORAL_TABLET | Freq: Once | ORAL | Status: AC
  Administered 2020-05-11: 800 mg via ORAL
  Filled 2020-05-11: qty 1
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
## 2020-09-20 DIAGNOSIS — Z9884 Bariatric surgery status: Secondary | ICD-10-CM | POA: Insufficient documentation

## 2020-09-20 HISTORY — DX: Morbid (severe) obesity due to excess calories: E66.01

## 2020-09-20 HISTORY — DX: Bariatric surgery status: Z98.84

## 2021-03-07 DIAGNOSIS — F408 Other phobic anxiety disorders: Secondary | ICD-10-CM

## 2021-03-07 HISTORY — DX: Other phobic anxiety disorders: F40.8

## 2021-09-13 ENCOUNTER — Emergency Department (HOSPITAL_BASED_OUTPATIENT_CLINIC_OR_DEPARTMENT_OTHER)
Admission: EM | Admit: 2021-09-13 | Discharge: 2021-09-13 | Disposition: A | Discharge status: Home / Self Care | Payer: Self-pay | Attending: Emergency Medicine | Admitting: Emergency Medicine

## 2021-09-13 ENCOUNTER — Emergency Department (HOSPITAL_BASED_OUTPATIENT_CLINIC_OR_DEPARTMENT_OTHER): Payer: Self-pay

## 2021-09-13 ENCOUNTER — Encounter (HOSPITAL_BASED_OUTPATIENT_CLINIC_OR_DEPARTMENT_OTHER): Payer: Self-pay | Admitting: Emergency Medicine

## 2021-09-13 ENCOUNTER — Other Ambulatory Visit: Payer: Self-pay

## 2021-09-13 DIAGNOSIS — R072 Precordial pain: Secondary | ICD-10-CM | POA: Insufficient documentation

## 2021-09-13 HISTORY — DX: Obesity, unspecified: E66.9

## 2021-09-13 LAB — BASIC METABOLIC PANEL
Anion gap: 8 (ref 5–15)
BUN: 15 mg/dL (ref 6–20)
CO2: 27 mmol/L (ref 22–32)
Calcium: 8.8 mg/dL — ABNORMAL LOW (ref 8.9–10.3)
Chloride: 102 mmol/L (ref 98–111)
Creatinine, Ser: 0.99 mg/dL (ref 0.61–1.24)
GFR, Estimated: >60 mL/min (ref >60)
Glucose, Bld: 90 mg/dL (ref 70–99)
Potassium: 3.7 mmol/L (ref 3.5–5.1)
Sodium: 137 mmol/L (ref 135–145)

## 2021-09-13 LAB — CBC WITH DIFFERENTIAL/PLATELET
Abs Immature Granulocytes: 0.03 10*3/uL (ref 0.00–0.07)
Basophils Absolute: 0 10*3/uL (ref 0.0–0.1)
Basophils Relative: 0 %
Eosinophils Absolute: 0.1 10*3/uL (ref 0.0–0.5)
Eosinophils Relative: 2 %
HCT: 45.1 % (ref 39.0–52.0)
Hemoglobin: 14.7 g/dL (ref 13.0–17.0)
Immature Granulocytes: 0 %
Lymphocytes Relative: 31 %
Lymphs Abs: 2.3 10*3/uL (ref 0.7–4.0)
MCH: 28 pg (ref 26.0–34.0)
MCHC: 32.6 g/dL (ref 30.0–36.0)
MCV: 85.9 fL (ref 80.0–100.0)
Monocytes Absolute: 0.6 10*3/uL (ref 0.1–1.0)
Monocytes Relative: 9 %
Neutro Abs: 4.2 10*3/uL (ref 1.7–7.7)
Neutrophils Relative %: 58 %
Platelets: 228 10*3/uL (ref 150–400)
RBC: 5.25 MIL/uL (ref 4.22–5.81)
RDW: 14.2 % (ref 11.5–15.5)
WBC: 7.3 10*3/uL (ref 4.0–10.5)
nRBC: 0 % (ref 0.0–0.2)

## 2021-09-13 LAB — TROPONIN I (HIGH SENSITIVITY)
Troponin I (High Sensitivity): 5 ng/L (ref <18)
Troponin I (High Sensitivity): 4 ng/L (ref <18)

## 2021-09-13 MED ORDER — ASPIRIN 81 MG PO CHEW
324.0000 mg | CHEWABLE_TABLET | Freq: Once | ORAL | Status: DC
  Filled 2021-09-13: qty 4

## 2021-09-13 MED ORDER — PANTOPRAZOLE SODIUM 40 MG PO TBEC: 40.0000 mg | DELAYED_RELEASE_TABLET | Freq: Every day | ORAL | 0 refills | Status: AC

## 2021-09-13 MED ORDER — MAALOX MAX 400-400-40 MG/5ML PO SUSP: 10.0000 mL | Freq: Four times a day (QID) | ORAL | 0 refills | Status: AC | PRN

## 2021-09-13 MED ORDER — KETOROLAC TROMETHAMINE 30 MG/ML IJ SOLN
30.0000 mg | Freq: Once | INTRAMUSCULAR | Status: AC
  Administered 2021-09-13: 30 mg via INTRAVENOUS
  Filled 2021-09-13: qty 1

## 2021-09-13 MED ORDER — PANTOPRAZOLE SODIUM 40 MG IV SOLR
40.0000 mg | Freq: Once | INTRAVENOUS | Status: AC
  Administered 2021-09-13: 40 mg via INTRAVENOUS
  Filled 2021-09-13: qty 10

## 2021-09-13 MED ORDER — ALUM & MAG HYDROXIDE-SIMETH 200-200-20 MG/5ML PO SUSP
15.0000 mL | Freq: Once | ORAL | Status: AC
  Administered 2021-09-13: 15 mL via ORAL
  Filled 2021-09-13: qty 30

## 2021-09-13 NOTE — ED Provider Notes (Signed)
Blood pressure 122/79, pulse (!) 58, resp. rate 17, SpO2 100 %. ? ?Assuming care from Dr. Florina Ou.  In short, Gary Hickman is a 46 y.o. male with a chief complaint of Chest Pain ?Marland Kitchen  Refer to the original H&P for additional details. ? ?The current plan of care is to follow up on second troponin and reassess. ? ?09:52 AM  ?Patient's second troponin is within normal limits.  No abdominal tenderness to suspect other acute GI source of pain.  Discussed with patient the differential at this point is broad.  He does have several risk factors for ACS.  Do plan for cardiology referral and follow-up for next week.  Discussed tricked ED return precautions.  Plan to start on PPI.  ? ? EKG Interpretation ? ?Date/Time:  Saturday September 13 2021 04:59:36 EDT ?Ventricular Rate:  63 ?PR Interval:  174 ?QRS Duration: 101 ?QT Interval:  404 ?QTC Calculation: 414 ?R Axis:   -23 ?Text Interpretation: Sinus rhythm Borderline left axis deviation ST elev, probable normal early repol pattern No previous ECGs available Confirmed by Molpus, Jenny Reichmann [phone]) on 09/13/2021 5:02:41 AM ?  ? ?  ? ? ? ?  ?Margette Fast, MD ?09/13/21 [phone] ? ?

## 2021-09-13 NOTE — ED Triage Notes (Signed)
Pt to ED by POV from home with c/o L sided cp onset of 1 week ago. Pt states this only occurs when he lies down. Arrives A+O, VSS, NADN ?

## 2021-09-13 NOTE — ED Provider Notes (Signed)
? ?[facility] ?Provider Note: Georgena Spurling, MD, [place] ? ?CSN: [id number] ?MRN: [id number] ?ARRIVAL: 09/13/21 at 0450 ?ROOM: [facility] ? ? ?CHIEF COMPLAINT  ?Chest Pain ? ? ?HISTORY OF PRESENT ILLNESS  ?09/13/21 5:04 AM ?Gary Hickman is a 46 y.o. male who sleeps on his stomach.  Once last week and again this morning developed left lower chest pain.  On the first occasion it got better when he turned over on his back.  This morning he could not get the pain to go away completely lying on his back and he still has some pain present.  The pain is not severe and he describes it as feeling like a bruise or like someone punched him in the chest.  He has had no associated shortness of breath, diaphoresis or nausea.  It is not worse with movement of his left arm, deep breathing, or palpation of his chest. ? ? ?Past Medical History:  ?Diagnosis Date  ? Obesity   ? ? ?Past Surgical History:  ?Procedure Laterality Date  ? BARIATRIC SURGERY    ? ? ?No family history on file. ? ?Social History  ? ?Tobacco Use  ? Smoking status: Never  ? Smokeless tobacco: Never  ?Substance Use Topics  ? Alcohol use: Not Currently  ? ? ?Prior to Admission medications   ?Medication Sig Start Date End Date Taking? Authorizing Provider  ?alum & mag hydroxide-simeth (MAALOX MAX) 400-400-40 MG/5ML suspension Take 10 mLs by mouth every 6 (six) hours as needed for indigestion. 09/13/21  Yes Long, Wonda Olds, MD  ?pantoprazole (PROTONIX) 40 MG tablet Take 1 tablet (40 mg total) by mouth daily. 09/13/21  Yes Long, Wonda Olds, MD  ?ibuprofen (ADVIL) 800 MG tablet Take 1 tablet (800 mg total) by mouth 3 (three) times daily. 05/11/20   Palumbo, April, MD  ?methocarbamol (ROBAXIN) 500 MG tablet Take 1 tablet (500 mg total) by mouth 2 (two) times daily. 05/11/20   Palumbo, April, MD  ? ? ?Allergies ?Morphine and related ? ? ?REVIEW OF SYSTEMS  ?Negative except as noted here or in the History of Present Illness. ? ? ?PHYSICAL EXAMINATION  ?Initial Vital  Signs ?Blood pressure 127/88, pulse 65, resp. rate 19, SpO2 97 %. ? ?Examination ?General: Well-developed, well-nourished male in no acute distress; appearance consistent with age of record ?HENT: normocephalic; atraumatic ?Eyes: Normal appearance ?Neck: supple ?Heart: regular rate and rhythm; no murmur ?Lungs: clear to auscultation bilaterally ?Chest: Nontender ?Abdomen: soft; nondistended; nontender; bowel sounds present ?Extremities: No deformity; full range of motion; pulses normal ?Neurologic: Awake, alert and oriented; motor function intact in all extremities and symmetric; no facial droop ?Skin: Warm and dry ?Psychiatric: Normal mood and affect ? ? ?RESULTS  ?Summary of this visit's results, reviewed and interpreted by myself: ? ? EKG Interpretation ? ?Date/Time:  Saturday September 13 2021 04:59:36 EDT ?Ventricular Rate:  63 ?PR Interval:  174 ?QRS Duration: 101 ?QT Interval:  404 ?QTC Calculation: 414 ?R Axis:   -23 ?Text Interpretation: Sinus rhythm Borderline left axis deviation ST elev, probable normal early repol pattern No previous ECGs available Confirmed by Tiffony Kite, Jenny Reichmann [phone]) on 09/13/2021 5:02:41 AM ?  ? ?  ? ?Laboratory Studies: ?Results for orders placed or performed during the hospital encounter of 09/13/21 (from the past 24 hour(s))  ?Basic metabolic panel     Status: Abnormal  ? Collection Time: 09/13/21  5:04 AM  ?Result Value Ref Range  ? Sodium 137 135 - 145 mmol/L  ? Potassium 3.7 3.5 -  5.1 mmol/L  ? Chloride 102 98 - 111 mmol/L  ? CO2 27 22 - 32 mmol/L  ? Glucose, Bld 90 70 - 99 mg/dL  ? BUN 15 6 - 20 mg/dL  ? Creatinine, Ser 0.99 0.61 - 1.24 mg/dL  ? Calcium 8.8 (L) 8.9 - 10.3 mg/dL  ? GFR, Estimated >60 >60 mL/min  ? Anion gap 8 5 - 15  ?Troponin I (High Sensitivity)     Status: None  ? Collection Time: 09/13/21  5:04 AM  ?Result Value Ref Range  ? Troponin I (High Sensitivity) 5 <18 ng/L  ?CBC with Differential/Platelet     Status: None  ? Collection Time: 09/13/21  5:04 AM  ?Result Value  Ref Range  ? WBC 7.3 4.0 - 10.5 K/uL  ? RBC 5.25 4.22 - 5.81 MIL/uL  ? Hemoglobin 14.7 13.0 - 17.0 g/dL  ? HCT 45.1 39.0 - 52.0 %  ? MCV 85.9 80.0 - 100.0 fL  ? MCH 28.0 26.0 - 34.0 pg  ? MCHC 32.6 30.0 - 36.0 g/dL  ? RDW 14.2 11.5 - 15.5 %  ? Platelets 228 150 - 400 K/uL  ? nRBC 0.0 0.0 - 0.2 %  ? Neutrophils Relative % 58 %  ? Neutro Abs 4.2 1.7 - 7.7 K/uL  ? Lymphocytes Relative 31 %  ? Lymphs Abs 2.3 0.7 - 4.0 K/uL  ? Monocytes Relative 9 %  ? Monocytes Absolute 0.6 0.1 - 1.0 K/uL  ? Eosinophils Relative 2 %  ? Eosinophils Absolute 0.1 0.0 - 0.5 K/uL  ? Basophils Relative 0 %  ? Basophils Absolute 0.0 0.0 - 0.1 K/uL  ? Immature Granulocytes 0 %  ? Abs Immature Granulocytes 0.03 0.00 - 0.07 K/uL  ?Troponin I (High Sensitivity)     Status: None  ? Collection Time: 09/13/21  7:24 AM  ?Result Value Ref Range  ? Troponin I (High Sensitivity) 4 <18 ng/L  ? ?Imaging Studies: ?DG Chest 2 View ? ?Result Date: 09/13/2021 ?CLINICAL DATA:  Left chest pain onset 1 week ago. EXAM: CHEST - 2 VIEW COMPARISON:  PA and lateral 05/11/2020 FINDINGS: The heart size and mediastinal contours are within normal limits. Both lungs are clear. 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 IMPRESSION: No active cardiopulmonary disease.  Stable chest. Electronically Signed   By: Telford Nab M.D.   On: 09/13/2021 05:48   ? ?ED COURSE and MDM  ?Nursing notes, initial and subsequent vitals signs, including pulse oximetry, reviewed and interpreted by myself. ? ?Vitals:  ? 09/13/21 0503 09/13/21 0630 09/13/21 0900 09/13/21 0955  ?BP: 127/88 122/79 (!) 112/97 132/87  ?Pulse: 65 (!) 58 69 70  ?Resp: 19 17 20 11   ?SpO2: 97% 100% 100% 100%  ? ?Medications  ?pantoprazole (PROTONIX) injection 40 mg (40 mg Intravenous Given 09/13/21 0519)  ?ketorolac (TORADOL) 30 MG/ML injection 30 mg (30 mg Intravenous Given 09/13/21 0729)  ?alum & mag hydroxide-simeth  (MAALOX/MYLANTA) 200-200-20 MG/5ML suspension 15 mL (15 mLs Oral Given 09/13/21 0729)  ? ?7:00 AM ?Chest pain is atypical for cardiac etiology. Second troponin pending. Signed out to Dr. Laverta Baltimore.  ? ? ?PROCEDURES  ?Procedures ? ? ?ED DIAGNOSES  ? ?  ICD-10-CM   ?1. Precordial chest pain  R07.2   ?  ? ? ? ?  ?Jaxx Huish, MD ?09/13/21 1125 ? ?

## 2021-09-13 NOTE — Discharge Instructions (Signed)

## 2021-09-17 ENCOUNTER — Encounter: Payer: Self-pay | Admitting: Cardiology

## 2021-09-17 ENCOUNTER — Ambulatory Visit (INDEPENDENT_AMBULATORY_CARE_PROVIDER_SITE_OTHER): Payer: Self-pay | Admitting: Cardiology

## 2021-09-17 VITALS — BP 132/78 | HR 57 | Ht 69.0 in | Wt 306.0 lb

## 2021-09-17 DIAGNOSIS — R011 Cardiac murmur, unspecified: Secondary | ICD-10-CM

## 2021-09-17 DIAGNOSIS — Z6841 Body Mass Index (BMI) 40.0 and over, adult: Secondary | ICD-10-CM

## 2021-09-17 DIAGNOSIS — R079 Chest pain, unspecified: Secondary | ICD-10-CM

## 2021-09-17 DIAGNOSIS — Z9884 Bariatric surgery status: Secondary | ICD-10-CM

## 2021-09-17 NOTE — Progress Notes (Signed)
?Cardiology Office Note:   ? ?Date:  09/17/2021  ? ?ID:  Gary Hickman, DOB 03/11/1976, MRN [id number] ? ?PCP:  Bailey Mech, PA-C  ?Cardiologist:  Garwin Brothers, MD  ? ?Referring MD: Maia Plan, MD  ? ? ?ASSESSMENT:   ? ?1. Chest pain, unspecified type   ?2. Murmur, cardiac   ? ?PLAN:   ? ?In order of problems listed above: ? ?Primary prevention stressed with the patient.  Importance of compliance with diet medication stressed and vocalized understanding. ?Chest pain: Atypical in nature.  I reviewed emergency room records.  EKG was reviewed.  In view of his symptoms we will do an exercise Myoview stress test.  This will help [place] understand his symptoms better. ?Cardiac murmur: Echocardiogram will be done to assess murmur heard on auscultation. ?Morbid obesity: Diet emphasized.  Lifestyle modification urged and he promises to do better.  Risks of obesity explained.  Further recommendations will be made based on the findings of the aforementioned tests. ?Patient will be seen in follow-up appointment in 6 months or earlier if the patient has any concerns ? ? ? ?Medication Adjustments/Labs and Tests Ordered: ?Current medicines are reviewed at length with the patient today.  Concerns regarding medicines are outlined above.  ?Orders Placed This Encounter  ?Procedures  ? MYOCARDIAL PERFUSION IMAGING  ? EKG 12-Lead  ? ECHOCARDIOGRAM COMPLETE  ? ?No orders of the defined types were placed in this encounter. ? ? ? ?History of Present Illness:   ? ?Gary Hickman is a 46 y.o. male who is being seen today for the evaluation of chest pain at the request of Long, Arlyss Repress, MD. patient is a pleasant 46 year old male.  He has past medical history that is not much significant.  He has history of morbid obesity.  He went to the hospital for chest pain.  He denies any orthopnea or PND.  He has chest tenderness substernal in nature.  He is an active gentleman but does not exercise on a regular basis activity including  sexual activity does not bring around chest pain.  At the time of my evaluation, the patient is alert awake oriented and in no distress.  No radiation of the symptoms to the neck or the arms.  No history of syncope. ? ?Past Medical History:  ?Diagnosis Date  ? Knee effusion, left 02/27/2020  ? Left leg pain 01/23/2020  ? Morbid obesity with BMI of 45.0-49.9, adult (HCC) 09/20/2020  ? Obesity   ? 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 Formatting of this note might be different from the original. 09/2020: Pre-surgery weight 448 lbs. Post-surgical low weight was 275 lbs.  ? ? ?Past Surgical History:  ?Procedure Laterality Date  ? BARIATRIC SURGERY    ? ? ?Current Medications: ?Current Meds  ?Medication Sig  ? alum & mag hydroxide-simeth (MAALOX MAX) 400-400-40 MG/5ML suspension Take 10 mLs by mouth every 6 (six) hours as needed for indigestion.  ? pantoprazole (PROTONIX) 40 MG tablet Take 1 tablet (40 mg total) by mouth daily.  ?  ? ?Allergies:   Morphine and related  ? ?Social History  ? ?Socioeconomic History  ? Marital status: Married  ?  Spouse name: Not on file  ? Number of children: Not on file  ? Years of education: Not on file  ? Highest education level: Not on file  ?Occupational History  ? Not on file  ?Tobacco Use  ? Smoking status: Never  ?  Smokeless tobacco: Never  ?Substance and Sexual Activity  ? Alcohol use: Not Currently  ? Drug use: Not on file  ? Sexual activity: Not on file  ?Other Topics Concern  ? Not on file  ?Social History Narrative  ? Not on file  ? ?Social Determinants of Health  ? ?Financial Resource Strain: Not on file  ?Food Insecurity: Not on file  ?Transportation Needs: Not on file  ?Physical Activity: Not on file  ?Stress: Not on file  ?Social Connections: Not on file  ?  ? ?Family History: ?The patient's family history includes Diabetes in his mother; Heart attack in his father; Heart disease in his father and mother; Hypertension in his father and  mother. There is no history of Cancer. ? ?ROS:   ?Please see the history of present illness.    ?All other systems reviewed and are negative. ? ?EKGs/Labs/Other Studies Reviewed:   ? ?The following studies were reviewed today: ?EKG reveals sinus rhythm poor anterior forces and anterior inferior leads. ? ? ?Recent Labs: ?09/13/2021: BUN 15; Creatinine, Ser 0.99; Hemoglobin 14.7; Platelets 228; Potassium 3.7; Sodium 137  ?Recent Lipid Panel ?No results found for: CHOL, TRIG, HDL, CHOLHDL, VLDL, LDLCALC, LDLDIRECT ? ?Physical Exam:   ? ?VS:  BP 132/78   Pulse (!) 57   Ht 5\' 9"  (1.753 m)   Wt (!) 306 lb (138.8 kg)   SpO2 98%   BMI 45.19 kg/m?    ? ?Wt Readings from Last 3 Encounters:  ?09/17/21 (!) 306 lb (138.8 kg)  ?05/11/20 300 lb (136.1 kg)  ?02/27/20 300 lb (136.1 kg)  ?  ? ?GEN: Patient is in no acute distress ?HEENT: Normal ?NECK: No JVD; No carotid bruits ?LYMPHATICS: No lymphadenopathy ?CARDIAC: S1 S2 regular, 2/6 systolic murmur at the apex. ?RESPIRATORY:  Clear to auscultation without rales, wheezing or rhonchi  ?ABDOMEN: Soft, non-tender, non-distended ?MUSCULOSKELETAL:  No edema; No deformity  ?SKIN: Warm and dry ?NEUROLOGIC:  Alert and oriented x 3 ?PSYCHIATRIC:  Normal affect  ? ? ?Signed, ?04/28/20, MD  ?09/17/2021 1:44 PM    ?[facility]   ?

## 2021-09-17 NOTE — Patient Instructions (Addendum)
Medication Instructions:  ?Your physician recommends that you continue on your current medications as directed. Please refer to the Current Medication list given to you today. ? ?*If you need a refill on your cardiac medications before your next appointment, please call your pharmacy* ? ? ?Lab Work: ?None ordered ?If you have labs (blood work) drawn today and your tests are completely normal, you will receive your results only by: ?MyChart Message (if you have MyChart) OR ?A paper copy in the mail ?If you have any lab test that is abnormal or we need to change your treatment, we will call you to review the results. ? ? ?Testing/Procedures: ?Your physician has requested that you have a lexiscan myoview. For further information please visit [email or web address] Please follow instruction sheet, as given. ? ?The test will be done over 2 days and take approximately 3 to 4 hours each day to complete; you may bring reading material.  If someone comes with you to your appointment, they will need to remain in the main lobby due to limited space in the testing area.  ? ?How to prepare for your Myocardial Perfusion Test: ?Do not eat or drink 3 hours prior to your test, except you may have water. ?Do not consume products containing caffeine (regular or decaffeinated) 12 hours prior to your test. (ex: coffee, chocolate, sodas, tea). ?Do bring a list of your current medications with you.  If not listed below, you may take your medications as normal. ?Do wear comfortable clothes (no dresses or overalls) and walking shoes, tennis shoes preferred (No heels or open toe shoes are allowed). ?Do NOT wear cologne, perfume, aftershave, or lotions (deodorant is allowed). ?If these instructions are not followed, your test will have to be rescheduled. ? ?Your physician has requested that you have an echocardiogram. Echocardiography is a painless test that uses sound waves to create images of your heart. It provides your doctor with  information about the size and shape of your heart and how well your heart?s chambers and valves are working. This procedure takes approximately one hour. There are no restrictions for this procedure. ? ? ?Follow-Up: ?At [facility], you and your health needs are our priority.  As part of our continuing mission to provide you with exceptional heart care, we have created designated Provider Care Teams.  These Care Teams include your primary Cardiologist (physician) and Advanced Practice Providers (APPs -  Physician Assistants and Nurse Practitioners) who all work together to provide you with the care you need, when you need it. ? ?We recommend signing up for the patient portal called "MyChart".  Sign up information is provided on this After Visit Summary.  MyChart is used to connect with patients for Virtual Visits (Telemedicine).  Patients are able to view lab/test results, encounter notes, upcoming appointments, etc.  Non-urgent messages can be sent to your provider as well.   ?To learn more about what you can do with MyChart, go to [email or web address].   ? ?Your next appointment:   ?6 month(s) ? ?The format for your next appointment:   ?In Person ? ?Provider:   ?Belva Crome, MD ? ? ?Other Instructions ?Cardiac Nuclear Scan ?A cardiac nuclear scan is a test that is done to check the flow of blood to your heart. It is done when you are resting and when you are exercising. The test looks for problems such as: ?Not enough blood reaching a portion of the heart. ?The heart muscle not working as it should. ?You may need this  test if: ?You have heart disease. ?You have had lab results that are not normal. ?You have had heart surgery or a balloon procedure to open up blocked arteries (angioplasty). ?You have chest pain. ?You have shortness of breath. ?In this test, a special dye (tracer) is put into your bloodstream. The tracer will travel to your heart. A camera will then take pictures of your heart to see how  the tracer moves through your heart. This test is usually done at a hospital and takes 2-4 hours. ?Tell a doctor about: ?Any allergies you have. ?All medicines you are taking, including vitamins, herbs, eye drops, creams, and over-the-counter medicines. ?Any problems you or family members have had with anesthetic medicines. ?Any blood disorders you have. ?Any surgeries you have had. ?Any medical conditions you have. ?Whether you are pregnant or may be pregnant. ?What are the risks? ?Generally, this is a safe test. However, problems may occur, such as: ?Serious chest pain and heart attack. This is only a risk if the stress portion of the test is done. ?Rapid heartbeat. ?A feeling of warmth in your chest. This feeling usually does not last long. ?Allergic reaction to the tracer. ?What happens before the test? ?Ask your doctor about changing or stopping your normal medicines. This is important. ?Follow instructions from your doctor about what you cannot eat or drink. ?Remove your jewelry on the day of the test. ?What happens during the test? ?An IV tube will be inserted into one of your veins. ?Your doctor will give you a small amount of tracer through the IV tube. ?You will wait for 20-40 minutes while the tracer moves through your bloodstream. ?Your heart will be monitored with an electrocardiogram (ECG). ?You will lie down on an exam table. ?Pictures of your heart will be taken for about 15-20 minutes. ?You may also have a stress test. For this test, one of these things may be done: ?You will be asked to exercise on a treadmill or a stationary bike. ?You will be given medicines that will make your heart work harder. This is done if you are unable to exercise. ?When blood flow to your heart has peaked, a tracer will again be given through the IV tube. ?After 20-40 minutes, you will get back on the exam table. More pictures will be taken of your heart. ?Depending on the tracer that is used, more pictures may need to  be taken 3-4 hours later. ?Your IV tube will be removed when the test is over. ?The test may vary among doctors and hospitals. ?What happens after the test? ?Ask your doctor: ?Whether you can return to your normal schedule, including diet, activities, and medicines. ?Whether you should drink more fluids. This will help to remove the tracer from your body. Drink enough fluid to keep your pee (urine) pale yellow. ?Ask your doctor, or the department that is doing the test: ?When will my results be ready? ?How will I get my results? ?Summary ?A cardiac nuclear scan is a test that is done to check the flow of blood to your heart. ?Tell your doctor whether you are pregnant or may be pregnant. ?Before the test, ask your doctor about changing or stopping your normal medicines. This is important. ?Ask your doctor whether you can return to your normal activities. You may be asked to drink more fluids. ?This information is not intended to replace advice given to you by your health care provider. Make sure you discuss any questions you have with your health  care provider. ?Document Revised: 09/28/2018 Document Reviewed: 11/22/2017 ?Elsevier Patient Education ? 2021 [employer] ? ? ? ?Echocardiogram ?An echocardiogram is a test that uses sound waves (ultrasound) to produce images of the heart. ?Images from an echocardiogram can provide important information about: ?Heart size and shape. ?The size and thickness and movement of your heart's walls. ?Heart muscle function and strength. ?Heart valve function or if you have stenosis. Stenosis is when the heart valves are too narrow. ?If blood is flowing backward through the heart valves (regurgitation). ?A tumor or infectious growth around the heart valves. ?Areas of heart muscle that are not working well because of poor blood flow or injury from a heart attack. ?Aneurysm detection. An aneurysm is a weak or damaged part of an artery wall. The wall bulges out from the normal force of  blood pumping through the body. ?Tell a health care provider about: ?Any allergies you have. ?All medicines you are taking, including vitamins, herbs, eye drops, creams, and over-the-counter medicines. ?Any blo

## 2021-10-01 DIAGNOSIS — Z170 Estrogen receptor positive status [ER+]: Secondary | ICD-10-CM

## 2021-10-01 HISTORY — DX: Malignant neoplasm of central portion of left male breast: Z17.0

## 2021-10-21 ENCOUNTER — Telehealth: Payer: Self-pay | Admitting: Cardiology

## 2021-10-21 NOTE — Telephone Encounter (Signed)
Request faxed to [place] as pt is preparing for surgery. ?

## 2021-10-21 NOTE — Telephone Encounter (Signed)
Patient's wife requesting orders stress test and echo [facility] [place] through [facility]. Phone [phone] ? ?

## 2021-10-22 ENCOUNTER — Telehealth: Payer: Self-pay | Admitting: Cardiology

## 2021-10-22 NOTE — Telephone Encounter (Signed)
Order faxed to [place]. ?

## 2021-10-22 NOTE — Telephone Encounter (Signed)
Vernona Rieger from [facility] called and said that they needs patients lab work from his miocardial purtusion. Fax number is [phone] ATTN to Lucilla Lame ?

## 2021-10-23 DIAGNOSIS — E669 Obesity, unspecified: Secondary | ICD-10-CM | POA: Insufficient documentation

## 2021-10-27 ENCOUNTER — Ambulatory Visit: Payer: Self-pay | Admitting: Cardiology
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
## 2022-06-16 ENCOUNTER — Emergency Department (HOSPITAL_BASED_OUTPATIENT_CLINIC_OR_DEPARTMENT_OTHER): Payer: PRIVATE HEALTH INSURANCE

## 2022-06-16 ENCOUNTER — Encounter (HOSPITAL_BASED_OUTPATIENT_CLINIC_OR_DEPARTMENT_OTHER): Payer: Self-pay

## 2022-06-16 ENCOUNTER — Other Ambulatory Visit: Payer: Self-pay

## 2022-06-16 DIAGNOSIS — R2242 Localized swelling, mass and lump, left lower limb: Secondary | ICD-10-CM | POA: Insufficient documentation

## 2022-06-16 DIAGNOSIS — M79605 Pain in left leg: Secondary | ICD-10-CM | POA: Insufficient documentation

## 2022-06-16 DIAGNOSIS — Z853 Personal history of malignant neoplasm of breast: Secondary | ICD-10-CM | POA: Insufficient documentation

## 2022-06-16 NOTE — ED Triage Notes (Addendum)
Pt with acute onset LLE pain earlier today and spouse thinks he might have a blood clot. 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 Pt noted to have more swelling to LLE and some warmth. Pt reports worsening pain to the extremity. Pt with recent dx of breast cancer and is taking Tamoxifen at this time.

## 2022-06-17 ENCOUNTER — Emergency Department (HOSPITAL_BASED_OUTPATIENT_CLINIC_OR_DEPARTMENT_OTHER)
Admission: EM | Admit: 2022-06-17 | Discharge: 2022-06-17 | Disposition: A | Discharge status: Home / Self Care | Payer: PRIVATE HEALTH INSURANCE | Attending: Emergency Medicine | Admitting: Emergency Medicine

## 2022-06-17 DIAGNOSIS — M79605 Pain in left leg: Secondary | ICD-10-CM

## 2022-06-17 NOTE — ED Provider Notes (Signed)
[facility] Provider Note: Lowella Dell, MD, FACEP  CSN: [id number] MRN: [id number] ARRIVAL: 06/16/22 at 2312 ROOM: [facility]11/[facility]   CHIEF COMPLAINT  Leg Pain   HISTORY OF PRESENT ILLNESS  06/17/22 1:24 AM Gary Hickman is a 46 y.o. male with a history of cancer of the male breast.  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  He is not a smoker.  He is here with left lower extremity pain and swelling and is concerned he may have a blood clot.  He rates the pain as a 5 out of 10, aching in nature.  He is having no chest pain or shortness of breath.   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 09/2020: Pre-surgery weight 448 lbs. Post-surgical low weight was 275 lbs.    Past Surgical History:  Procedure Laterality Date   BARIATRIC SURGERY      Family History  Problem Relation Age of Onset   Heart disease Mother    Hypertension Mother    Diabetes Mother    Heart disease Father    Hypertension Father    Heart attack Father    Cancer Neg Hx     Social History   Tobacco Use   Smoking status: Never   Smokeless tobacco: Never  Substance Use Topics   Alcohol use: Not Currently    Prior to Admission medications   Medication Sig Start Date End Date Taking? Authorizing Provider  alum & mag hydroxide-simeth (MAALOX MAX) 400-400-40 MG/5ML suspension Take 10 mLs by mouth every 6 (six) hours as needed for indigestion. 09/13/21   Long, Arlyss Repress, MD  pantoprazole (PROTONIX) 40 MG tablet Take 1 tablet (40 mg total) by mouth daily. 09/13/21   Long, Arlyss Repress, MD    Allergies Morphine and  related   REVIEW OF SYSTEMS  Negative except as noted here or in the History of Present Illness.   PHYSICAL EXAMINATION  Initial Vital Signs Blood pressure (!) 144/87, pulse (!) 54, temperature 98 F (36.7 C), temperature source Oral, resp. rate 20, height 5\' 9"  (1.753 m), weight (!) 140.6 kg, SpO2 96 %.  Examination General: Well-developed, well-nourished male in no acute distress; appearance consistent with age of record HENT: normocephalic; atraumatic Eyes: Normal appearance Neck: supple Heart: regular rate and rhythm Lungs: clear to auscultation bilaterally Abdomen: soft; nondistended; nontender; bowel sounds present Extremities: No deformity; full range of motion; trace edema of lower legs; mild tenderness of left calf with palpable varicose veins Neurologic: Awake, alert and oriented; motor function intact in all extremities and symmetric; no facial droop Skin: Warm and dry Psychiatric: Normal mood and affect   RESULTS  Summary of this visit's results, reviewed and interpreted by myself:   EKG Interpretation  Date/Time:    Ventricular Rate:    PR Interval:    QRS Duration:   QT Interval:    QTC Calculation:   R Axis:     Text Interpretation:         Laboratory Studies: No results found for this or any previous visit (from the past  24 hour(s)). Imaging Studies: US Venous Img Lower Unilateral Left  Result Date: 06/17/2022 CLINICAL DATA:  Left calf swelling EXAM: LEFT LOWER EXTREMITY VENOUS DOPPLER ULTRASOUND TECHNIQUE: Gray-scale sonography with compression, as well as color and duplex ultrasound, were performed to evaluate the deep venous system(s) from the level of the common femoral vein through the popliteal and proximal calf veins. COMPARISON:  None Available. FINDINGS: VENOUS Normal compressibility of the common femoral, superficial femoral, and popliteal veins, as well as the visualized calf veins. Visualized portions of profunda femoral vein and great  saphenous vein unremarkable. No filling defects to suggest DVT on grayscale or color Doppler imaging. Doppler waveforms show normal direction of venous flow, normal respiratory plasticity and response to augmentation. Limited views of the contralateral common femoral vein are unremarkable. OTHER None. Limitations: none IMPRESSION: Negative. Electronically Signed   By: Helyn Numbers M.D.   On: 06/17/2022 00:46    ED COURSE and MDM  Nursing notes, initial and subsequent vitals signs, including pulse oximetry, reviewed and interpreted by myself.  Vitals:   06/16/22 2331 06/16/22 2338  BP: (!) 144/87   Pulse: (!) 54   Resp: 20   Temp: 98 F (36.7 C)   TempSrc: Oral   SpO2: 96%   Weight:  (!) 140.6 kg  Height:  5\' 9"  (1.753 m)   Medications - No data to display  Venous ultrasound of the left lower extremity negative for deep vein thrombosis.  PROCEDURES  Procedures   ED DIAGNOSES     ICD-10-CM   1. Left leg pain  M79.605          Gary Hickman, , MD 06/17/22 [phone]

## 2022-10-05 ENCOUNTER — Encounter: Payer: Self-pay | Admitting: *Deleted

## 2023-08-13 IMAGING — CR DG CHEST 2V
2 series · 2 of 2 positions shown · non-contrast
Comparison: PA and lateral 05/11/2020

CLINICAL DATA: Left chest pain onset 1 week ago.

EXAM:
CHEST - 2 VIEW

[w chest pa]
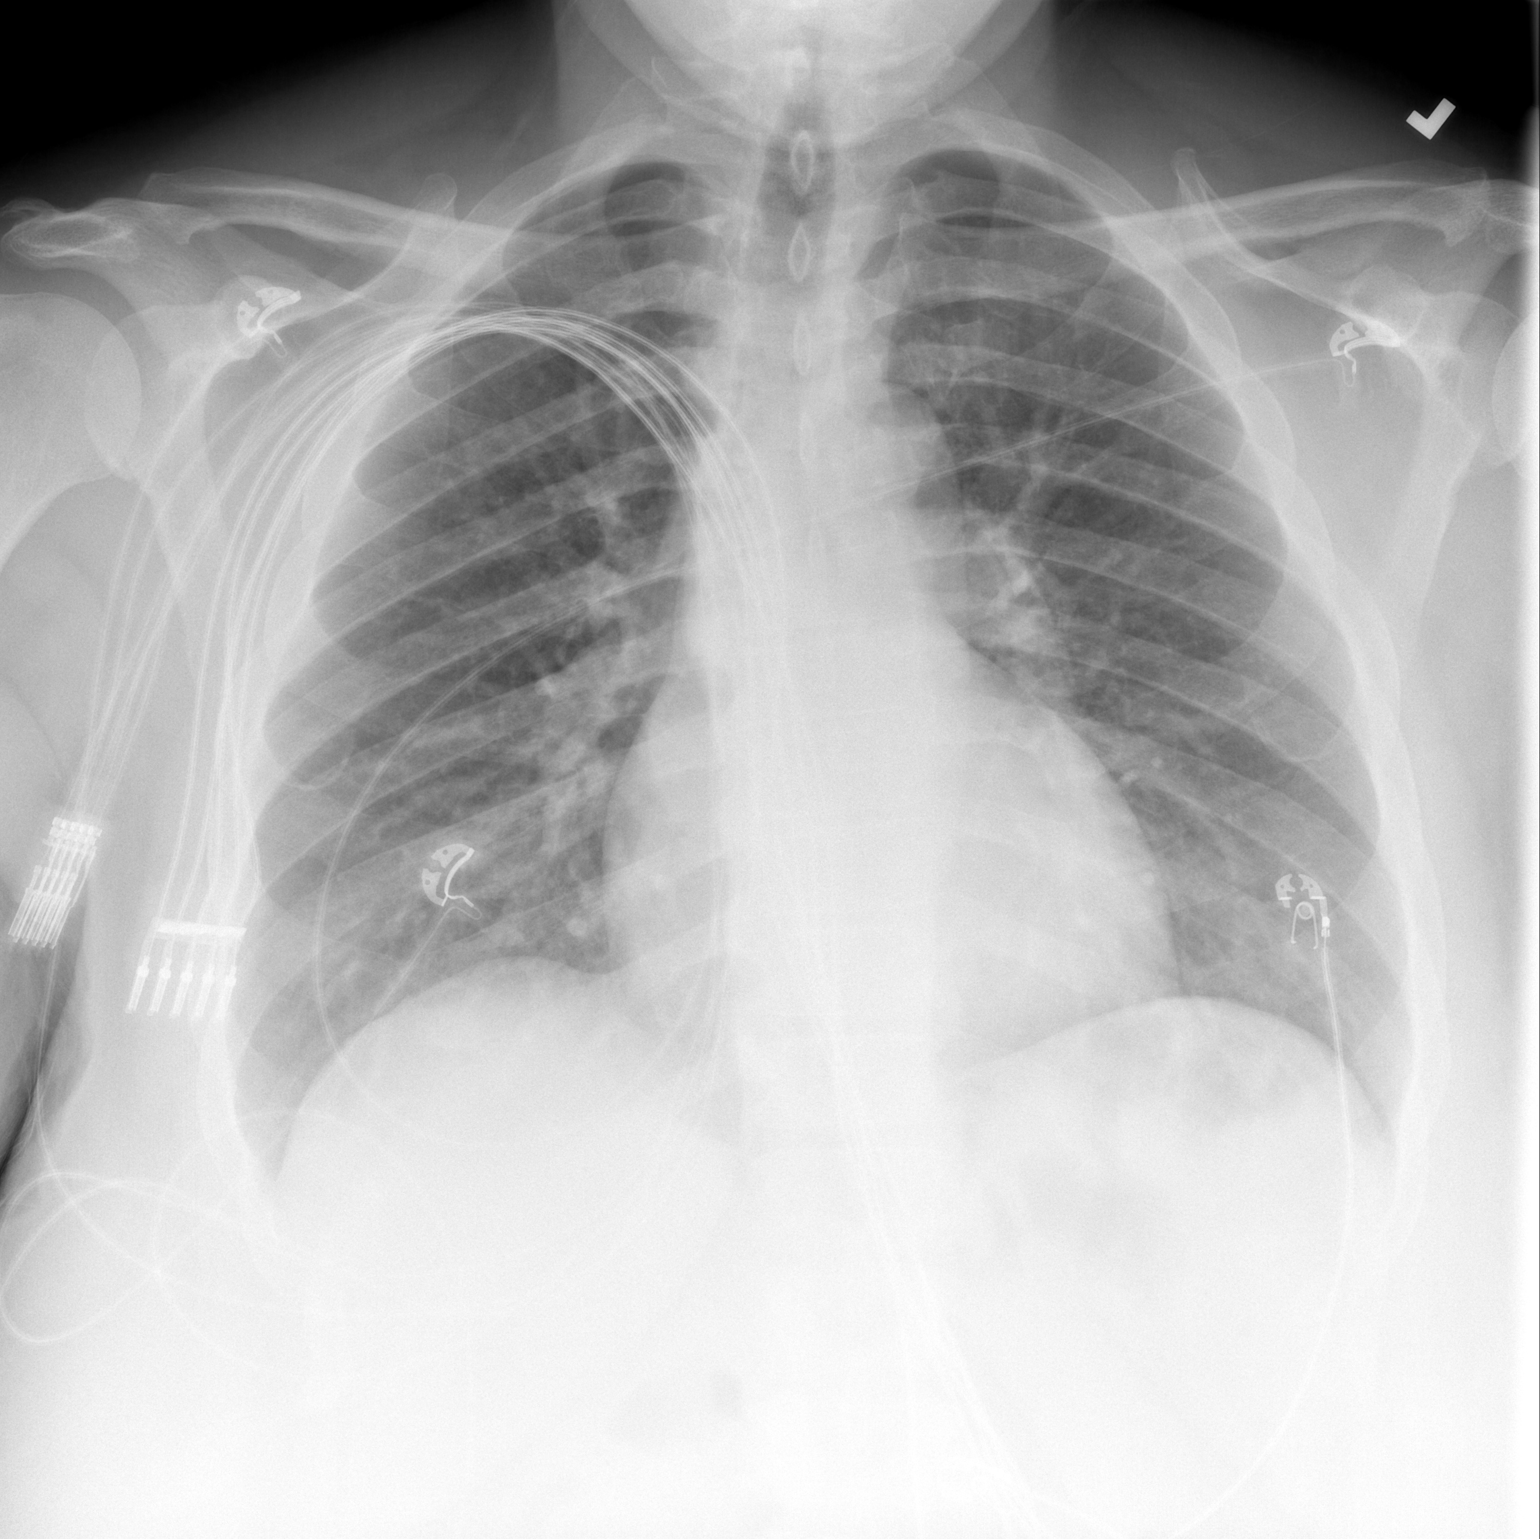

[w chest lat]
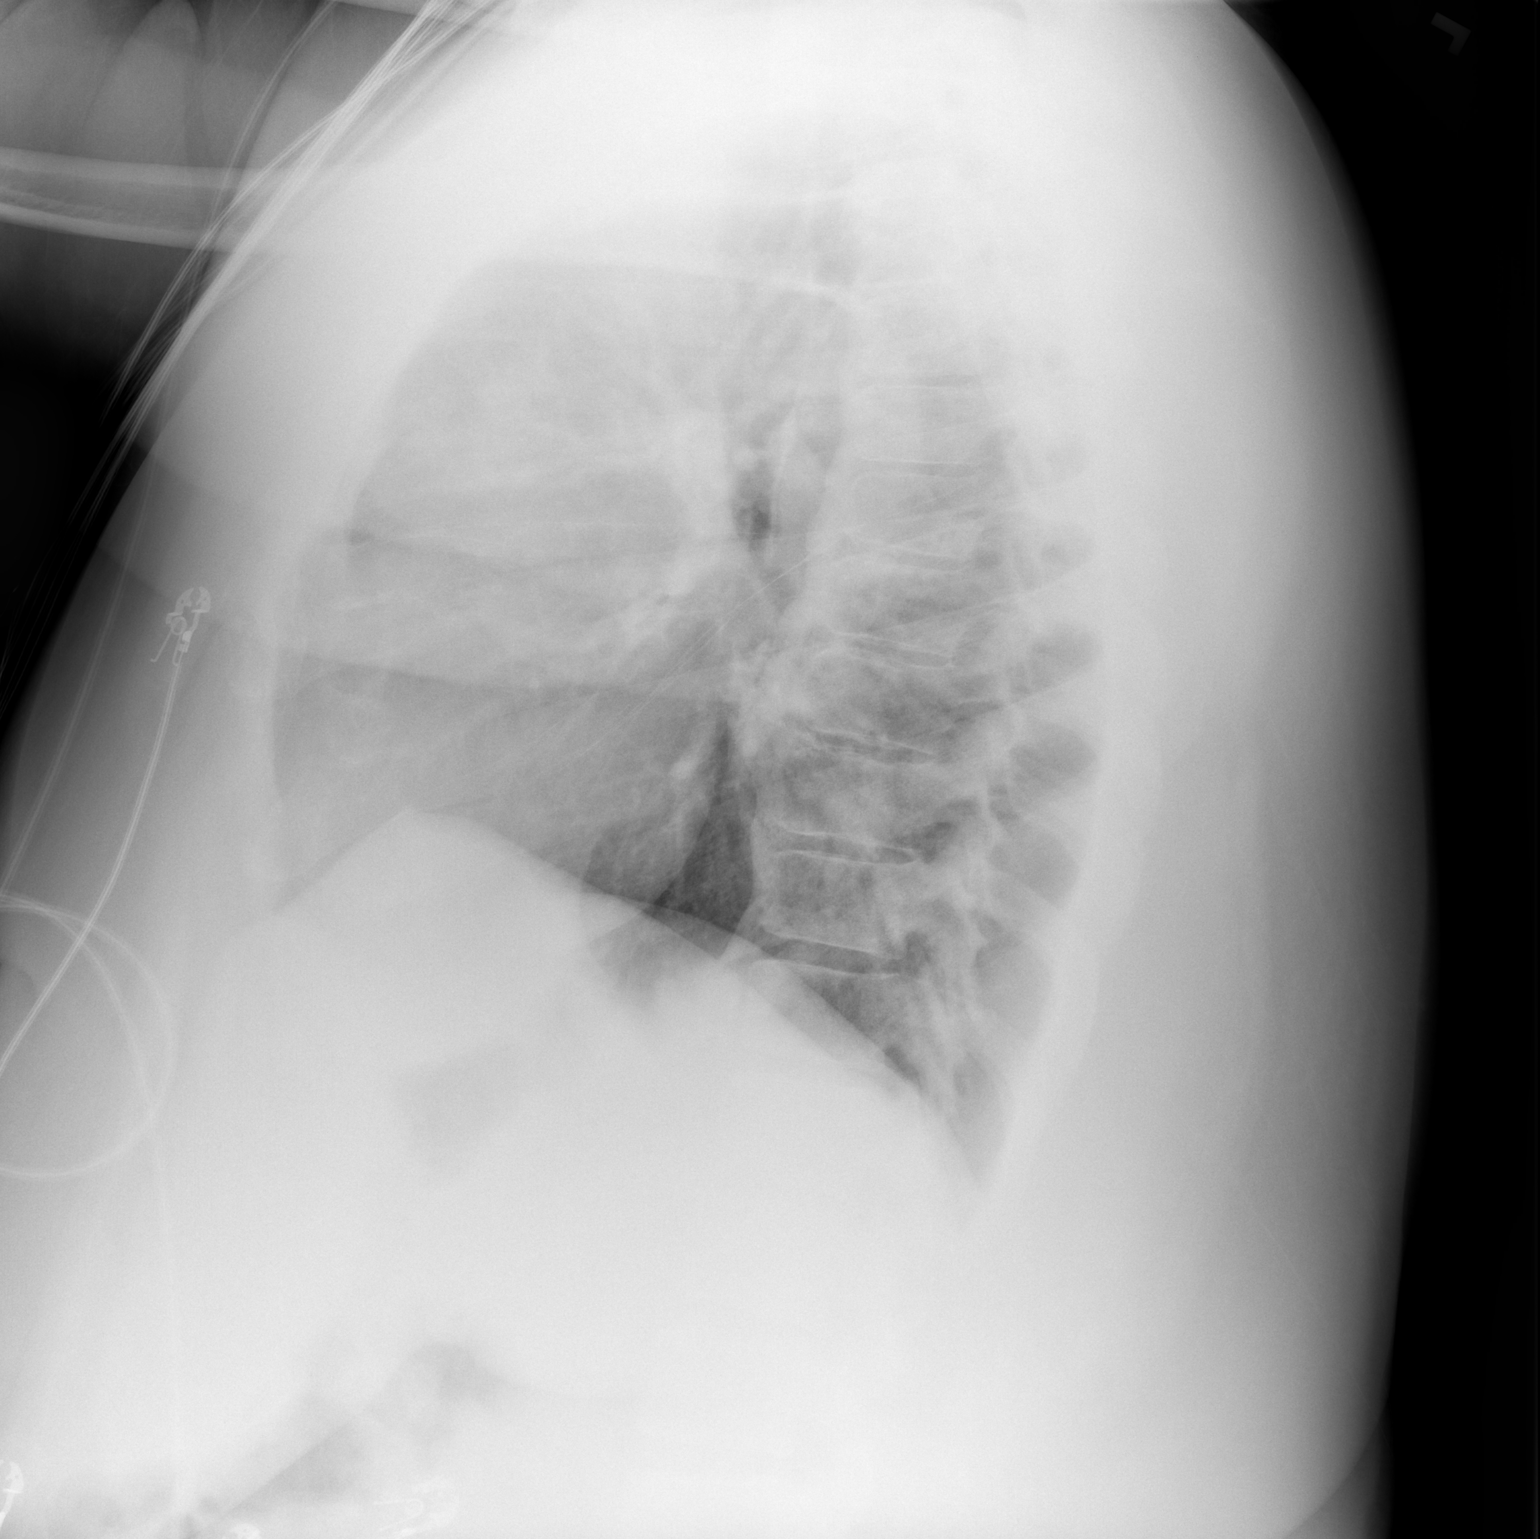

[2 of 2 positions shown; findings below may reference images not displayed]

FINDINGS: The heart size and mediastinal contours are within normal limits.
Both lungs are clear. The visualized skeletal structures are intact,
with again noted extensive anterior bridging enthesopathy of the
thoracic spine of DISH.
IMPRESSION: No active cardiopulmonary disease.  Stable chest.

## 2024-06-15 ENCOUNTER — Ambulatory Visit: Admission: RE | Admit: 2024-06-15 | Payer: 59 | Source: Home / Self Care | Admitting: Gastroenterology

## 7055-10-21 DEATH — deceased
